# Patient Record
Sex: Female | Born: 1940 | Race: White | Hispanic: No | Marital: Married | Smoking: Never smoker
Health system: Southern US, Community
[De-identification: ages and names within clinical notes are randomized; demographics above are authoritative.]

## PROBLEM LIST (undated history)

## (undated) DIAGNOSIS — G20 Parkinson's disease: Secondary | ICD-10-CM

## (undated) DIAGNOSIS — R12 Heartburn: Secondary | ICD-10-CM

## (undated) DIAGNOSIS — I10 Essential (primary) hypertension: Secondary | ICD-10-CM

## (undated) DIAGNOSIS — R42 Dizziness and giddiness: Secondary | ICD-10-CM

## (undated) DIAGNOSIS — F329 Major depressive disorder, single episode, unspecified: Secondary | ICD-10-CM

## (undated) DIAGNOSIS — G20A1 Parkinson's disease without dyskinesia, without mention of fluctuations: Secondary | ICD-10-CM

## (undated) DIAGNOSIS — M419 Scoliosis, unspecified: Secondary | ICD-10-CM

## (undated) DIAGNOSIS — F419 Anxiety disorder, unspecified: Secondary | ICD-10-CM

## (undated) DIAGNOSIS — K219 Gastro-esophageal reflux disease without esophagitis: Secondary | ICD-10-CM

## (undated) DIAGNOSIS — E785 Hyperlipidemia, unspecified: Secondary | ICD-10-CM

## (undated) DIAGNOSIS — C801 Malignant (primary) neoplasm, unspecified: Secondary | ICD-10-CM

## (undated) DIAGNOSIS — F32A Depression, unspecified: Secondary | ICD-10-CM

## (undated) DIAGNOSIS — B159 Hepatitis A without hepatic coma: Secondary | ICD-10-CM

## (undated) HISTORY — PX: ABDOMINAL HYSTERECTOMY: SHX81

## (undated) HISTORY — DX: Anxiety disorder, unspecified: F41.9

## (undated) HISTORY — DX: Heartburn: R12

## (undated) HISTORY — DX: Hepatitis a without hepatic coma: B15.9

## (undated) HISTORY — DX: Essential (primary) hypertension: I10

## (undated) HISTORY — DX: Depression, unspecified: F32.A

## (undated) HISTORY — DX: Hyperlipidemia, unspecified: E78.5

## (undated) HISTORY — DX: Major depressive disorder, single episode, unspecified: F32.9

---

## 1960-05-09 DIAGNOSIS — B159 Hepatitis A without hepatic coma: Secondary | ICD-10-CM

## 1960-05-09 HISTORY — DX: Hepatitis a without hepatic coma: B15.9

## 1975-05-10 HISTORY — PX: ABDOMINAL HYSTERECTOMY: SHX81

## 2003-12-31 ENCOUNTER — Other Ambulatory Visit: Payer: Self-pay

## 2004-08-30 ENCOUNTER — Emergency Department: Payer: Self-pay | Admitting: Emergency Medicine

## 2004-12-13 ENCOUNTER — Ambulatory Visit: Payer: Self-pay | Admitting: Obstetrics and Gynecology

## 2005-11-25 ENCOUNTER — Ambulatory Visit: Payer: Self-pay | Admitting: Internal Medicine

## 2006-10-12 ENCOUNTER — Ambulatory Visit: Payer: Self-pay | Admitting: Internal Medicine

## 2007-08-04 ENCOUNTER — Ambulatory Visit: Payer: Self-pay | Admitting: Family Medicine

## 2008-04-28 ENCOUNTER — Ambulatory Visit: Payer: Self-pay | Admitting: Internal Medicine

## 2009-03-06 ENCOUNTER — Ambulatory Visit: Payer: Self-pay | Admitting: Internal Medicine

## 2010-01-18 ENCOUNTER — Ambulatory Visit: Payer: Self-pay | Admitting: Internal Medicine

## 2010-02-02 ENCOUNTER — Ambulatory Visit: Payer: Self-pay | Admitting: Internal Medicine

## 2010-02-24 ENCOUNTER — Inpatient Hospital Stay: Payer: Self-pay | Admitting: Family Medicine

## 2010-07-20 ENCOUNTER — Ambulatory Visit: Payer: Self-pay | Admitting: Internal Medicine

## 2010-08-27 ENCOUNTER — Ambulatory Visit: Payer: Self-pay | Admitting: Internal Medicine

## 2011-01-24 ENCOUNTER — Ambulatory Visit: Payer: Self-pay | Admitting: Family Medicine

## 2012-03-06 ENCOUNTER — Ambulatory Visit: Payer: Self-pay | Admitting: Internal Medicine

## 2012-08-24 ENCOUNTER — Ambulatory Visit: Payer: Self-pay | Admitting: Emergency Medicine

## 2012-10-04 ENCOUNTER — Ambulatory Visit: Payer: Self-pay | Admitting: Family Medicine

## 2012-10-25 ENCOUNTER — Ambulatory Visit: Payer: Self-pay | Admitting: Internal Medicine

## 2013-03-07 ENCOUNTER — Ambulatory Visit: Payer: Self-pay | Admitting: Internal Medicine

## 2013-03-28 ENCOUNTER — Ambulatory Visit: Payer: Self-pay | Admitting: Internal Medicine

## 2013-07-18 ENCOUNTER — Ambulatory Visit: Payer: Self-pay | Admitting: Internal Medicine

## 2013-10-09 ENCOUNTER — Emergency Department: Payer: Self-pay | Admitting: Emergency Medicine

## 2013-12-24 ENCOUNTER — Ambulatory Visit: Payer: Self-pay | Admitting: Unknown Physician Specialty

## 2014-06-09 ENCOUNTER — Ambulatory Visit: Payer: Self-pay | Admitting: Internal Medicine

## 2015-06-09 ENCOUNTER — Other Ambulatory Visit: Payer: Self-pay | Admitting: Internal Medicine

## 2015-06-09 DIAGNOSIS — Z1231 Encounter for screening mammogram for malignant neoplasm of breast: Secondary | ICD-10-CM

## 2015-06-16 ENCOUNTER — Other Ambulatory Visit: Payer: Self-pay | Admitting: Internal Medicine

## 2015-06-16 ENCOUNTER — Ambulatory Visit
Admission: RE | Admit: 2015-06-16 | Discharge: 2015-06-16 | Disposition: A | Discharge status: Home / Self Care | Payer: Medicare Other | Source: Ambulatory Visit | Attending: Internal Medicine | Admitting: Internal Medicine

## 2015-06-16 DIAGNOSIS — Z1231 Encounter for screening mammogram for malignant neoplasm of breast: Secondary | ICD-10-CM | POA: Diagnosis not present

## 2016-02-14 ENCOUNTER — Ambulatory Visit
Admission: EM | Admit: 2016-02-14 | Discharge: 2016-02-14 | Disposition: A | Discharge status: Home / Self Care | Payer: Medicare Other | Attending: Family Medicine | Admitting: Family Medicine

## 2016-02-14 ENCOUNTER — Encounter: Payer: Self-pay | Admitting: Emergency Medicine

## 2016-02-14 DIAGNOSIS — K0889 Other specified disorders of teeth and supporting structures: Secondary | ICD-10-CM

## 2016-02-14 HISTORY — DX: Parkinson's disease without dyskinesia, without mention of fluctuations: G20.A1

## 2016-02-14 HISTORY — DX: Parkinson's disease: G20

## 2016-02-14 MED ORDER — HYDROCODONE-ACETAMINOPHEN 5-325 MG PO TABS: 1.0000 | ORAL_TABLET | Freq: Four times a day (QID) | ORAL | 0 refills | Status: DC | PRN

## 2016-02-14 MED ORDER — AMOXICILLIN-POT CLAVULANATE 875-125 MG PO TABS: 1.0000 | ORAL_TABLET | Freq: Two times a day (BID) | ORAL | 0 refills | Status: DC

## 2016-02-14 NOTE — Discharge Instructions (Signed)
Take the medication as prescribed.  Be sure to see a Dentist ASAP.  Dr. Lacinda Axon

## 2016-02-14 NOTE — ED Provider Notes (Signed)
[facility]    CSN: [id number] Arrival date & time: 02/14/16  1327  History   Chief Complaint Chief Complaint  Patient presents with  . Dental Pain   HPI  75 year old female with Parkinson's, anxiety, depression, hypertension, hyperlipidemia presents with complaints of dental pain.  Patient reports severe dental pain. She states it is located at the left lower molar (last). No reported trauma or chipping of the tooth. She states that it started suddenly. She has used Orajel as well as Tylenol without relief. She states that she called her dentist and no one has returned her call. She states that it appears they do not take after-hours call. No other reported symptoms. No other complaints or concerns at this time.  PMH - Parkinson's, anxiety, depression, hypertension, hyperlipidemia  Past Surgical History:  Procedure Laterality Date  . ABDOMINAL HYSTERECTOMY     OB History    No data available     Home Medications    Prior to Admission medications   Medication Sig Start Date End Date Taking? Authorizing Provider  amoxicillin-clavulanate (AUGMENTIN) 875-125 MG tablet Take 1 tablet by mouth every 12 (twelve) hours. 02/14/16   Coral Spikes, DO  HYDROcodone-acetaminophen (NORCO/VICODIN) 5-325 MG tablet Take 1 tablet by mouth every 6 (six) hours as needed. 02/14/16   Coral Spikes, DO   Family History Family History  Problem Relation Age of Onset  . Breast cancer Mother 39   Social History Social History  Substance Use Topics  . Smoking status: Never Smoker  . Smokeless tobacco: Never Used  . Alcohol use No   Allergies   Ultram [tramadol hcl]  Review of Systems Review of Systems  Constitutional: Negative.   HENT:       Severe dental pain.  All other systems reviewed and are negative.  Physical Exam Triage Vital Signs ED Triage Vitals  Enc Vitals Group     BP 02/14/16 1351 (!) 149/65     Pulse Rate 02/14/16 1351 76     Resp 02/14/16 1351 16     Temp  02/14/16 1351 97.3 F (36.3 C)     Temp Source 02/14/16 1351 Tympanic     SpO2 02/14/16 1351 100 %     Weight 02/14/16 1349 120 lb (54.4 kg)     Height 02/14/16 1349 5\' 1"  (1.549 m)     Head Circumference --      Peak Flow --      Pain Score 02/14/16 1351 10     Pain Loc --      Pain Edu? --      Excl. in [place]? --    Updated Vital Signs BP (!) 149/65 (BP Location: Right Arm)   Pulse 76   Temp 97.3 F (36.3 C) (Tympanic)   Resp 16   Ht 5\' 1"  (1.549 m)   Wt 120 lb (54.4 kg)   SpO2 100%   BMI 22.67 kg/m   Physical Exam  Constitutional: She appears well-developed.  Appears in severe pain.  HENT:  Head: Normocephalic and atraumatic.  Oropharynx clear.  No discrete area of dental abscess. She is exquisitely tender to palpation of the last molar (lower left).  Eyes: Conjunctivae are normal.  Neck: Neck supple.  Cardiovascular: Normal rate and regular rhythm.   Pulmonary/Chest: Effort normal and breath sounds normal.  Abdominal: Soft. She exhibits no distension. There is no tenderness. There is no rebound and no guarding.  Musculoskeletal: Normal range of motion.  Neurological: She is  alert.  Tremor noted.  Skin: Skin is warm. No rash noted.  Psychiatric: She has a normal mood and affect.  Vitals reviewed.  UC Treatments / Results  Labs (all labs ordered are listed, but only abnormal results are displayed) Labs Reviewed - No data to display  EKG  EKG Interpretation None      Radiology No results found.  Procedures Procedures (including critical care time)  Medications Ordered in UC Medications - No data to display  Initial Impression / Assessment and Plan / UC Course  I have reviewed the triage vital signs and the nursing notes.  Pertinent labs & imaging results that were available during my care of the patient were reviewed by me and considered in my medical decision making (see chart for details).  Clinical Course  75 year old female presents with acute  dental pain. I cannot discern a discrete area of abscess or infection. However, given severity of pain will cover with Augmentin.  Treating pain with Vicodin. Patient to call her dentist or find another dentist to see urgently tomorrow.  Final Clinical Impressions(s) / UC Diagnoses   Final diagnoses:  Pain, dental   New Prescriptions Discharge Medication List as of 02/14/2016  2:23 PM    START taking these medications   Details  amoxicillin-clavulanate (AUGMENTIN) 875-125 MG tablet Take 1 tablet by mouth every 12 (twelve) hours., Starting Sun 02/14/2016, Print    HYDROcodone-acetaminophen (NORCO/VICODIN) 5-325 MG tablet Take 1 tablet by mouth every 6 (six) hours as needed., Starting Sun 02/14/2016, [place], DO 02/14/16 1433

## 2016-02-14 NOTE — ED Triage Notes (Signed)
Patient c/o tooth pain on the left side on the bottom that started on Thursday.

## 2016-06-20 ENCOUNTER — Other Ambulatory Visit: Payer: Self-pay | Admitting: Internal Medicine

## 2016-06-20 DIAGNOSIS — Z1231 Encounter for screening mammogram for malignant neoplasm of breast: Secondary | ICD-10-CM

## 2016-07-18 ENCOUNTER — Ambulatory Visit: Payer: Medicare Other

## 2016-08-15 ENCOUNTER — Ambulatory Visit
Admission: RE | Admit: 2016-08-15 | Discharge: 2016-08-15 | Disposition: A | Discharge status: Home / Self Care | Payer: Medicare Other | Source: Ambulatory Visit | Attending: Internal Medicine | Admitting: Internal Medicine

## 2016-08-15 DIAGNOSIS — Z1231 Encounter for screening mammogram for malignant neoplasm of breast: Secondary | ICD-10-CM | POA: Diagnosis not present

## 2016-08-15 DIAGNOSIS — N6489 Other specified disorders of breast: Secondary | ICD-10-CM | POA: Diagnosis not present

## 2016-12-01 NOTE — Progress Notes (Signed)
12/02/2016 11:01 AM   Julie Lynn 1941-03-28 [id number]  Referring provider: Adin Hector, MD [facility] [place], [place] [address]  Chief Complaint  Patient presents with  . New Patient (Initial Visit)    urinary incontinence referred by Dr. Kerrin Mo    HPI: Patient is a 76 -year-old Caucasian female who is referred to [place] by, Dr. Kerrin Mo, for urinary incontinence.  Patient states that she has had urinary incontinence for two to three months.  Patient has incontinence with SUI and UI.   She is experiencing several little incontinent episodes during the day. She is experiencing zero incontinent episodes during the night.  Her incontinence volume is mild to moderate   She is wearing one pads/depends daily.    She is having associated urgency, nocturia and intermittency.  She does not have a history of urinary tract infections, STI's or injury to the bladder.   She denies dysuria, gross hematuria, suprapubic pain, back pain, abdominal pain or flank pain.  She has not had any recent fevers, chills, nausea or vomiting.   She does not have a history of nephrolithiasis, GU surgery or GU trauma.   She is not sexually active.  She is post menopausal.   She admits to constipation and/or diarrhea.  She is having pain with bladder filling.    She has not had any recent imaging studies.    She is not drinking enough water daily.   She is not drinking caffeinated beverages daily.  She is not drinking alcoholic beverages daily.    Her risk factors for incontinence are a family history of incontinence, age, vaginal atrophy, pelvic surgery and neurological disorders.   She is taking skeletal muscle relaxants.  Her PVR is 0 mL.    Reviewed referral notes.    PMH: Past Medical History:  Diagnosis Date  . Anxiety   . Depression   . Heartburn   . Hepatitis A   . HLD (hyperlipidemia)   . HTN (hypertension)   . Parkinson disease [facility])      Surgical History: Past Surgical History:  Procedure Laterality Date  . ABDOMINAL HYSTERECTOMY      Home Medications:  Allergies as of 12/02/2016      Reactions   Simvastatin Other (See Comments)   Unspecified   Ultram [tramadol Hcl] Swelling      Medication List       Accurate as of 12/02/16 11:01 AM. Always use your most recent med list.          alendronate 70 MG tablet Commonly known as:  FOSAMAX Take by mouth.   amoxicillin-clavulanate 875-125 MG tablet Commonly known as:  AUGMENTIN Take 1 tablet by mouth every 12 (twelve) hours.   aspirin 81 MG tablet Take by mouth.   carbidopa-levodopa 25-100 MG tablet Commonly known as:  SINEMET IR Take 6 and 1/2 tablets daily as directed   ezetimibe 10 MG tablet Commonly known as:  ZETIA Take by mouth.   HYDROcodone-acetaminophen 5-325 MG tablet Commonly known as:  NORCO/VICODIN Take 1 tablet by mouth every 6 (six) hours as needed.   omeprazole 20 MG capsule Commonly known as:  PRILOSEC TAKE 1 CAPSULE DAILY   Vitamin D (Ergocalciferol) 50000 units Caps capsule Commonly known as:  DRISDOL TAKE 1 CAPSULE ONCE A WEEK       Allergies:  Allergies  Allergen Reactions  . Simvastatin Other (See Comments)    Unspecified  . Ultram [place] Mode  Hcl] Swelling    Family History: Family History  Problem Relation Age of Onset  . Breast cancer Mother 54  . Kidney cancer Neg Hx   . Bladder Cancer Neg Hx     Social History:  reports that she has never smoked. She has never used smokeless tobacco. She reports that she does not drink alcohol or use drugs.  ROS: UROLOGY Frequent Urination?: No Hard to postpone urination?: Yes Burning/pain with urination?: No Get up at night to urinate?: Yes Leakage of urine?: Yes Urine stream starts and stops?: Yes Trouble starting stream?: No Do you have to strain to urinate?: No Blood in urine?: No Urinary tract infection?: No Sexually transmitted disease?: No Injury to  kidneys or bladder?: No Painful intercourse?: No Weak stream?: No Currently pregnant?: No Vaginal bleeding?: No Last menstrual period?: n  Gastrointestinal Nausea?: Yes Vomiting?: No Indigestion/heartburn?: No Diarrhea?: No Constipation?: No  Constitutional Fever: No Night sweats?: No Weight loss?: No Fatigue?: Yes  Skin Skin rash/lesions?: No Itching?: Yes  Eyes Blurred vision?: No Double vision?: No  Ears/Nose/Throat Sore throat?: No Sinus problems?: No  Hematologic/Lymphatic Swollen glands?: No Easy bruising?: Yes  Cardiovascular Leg swelling?: No Chest pain?: No  Respiratory Cough?: No Shortness of breath?: No  Endocrine Excessive thirst?: No  Musculoskeletal Back pain?: No Joint pain?: No  Neurological Headaches?: Yes Dizziness?: No  Psychologic Depression?: No Anxiety?: No  Physical Exam: BP 127/65   Pulse 77   Ht 5\' 1"  (1.549 m)   Wt 130 lb (59 kg)   BMI 24.56 kg/m   Constitutional: Well nourished. Alert and oriented, No acute distress. HEENT: [place] AT, moist mucus membranes. Trachea midline, no masses. Cardiovascular: No clubbing, cyanosis, or edema. Respiratory: Normal respiratory effort, no increased work of breathing. GI: Abdomen is soft, non tender, non distended, no abdominal masses. Liver and spleen not palpable.  No hernias appreciated.  Stool sample for occult testing is not indicated.   GU: No CVA tenderness.  No bladder fullness or masses.  Atrophic external genitalia, normal pubic hair distribution, no lesions.  Normal urethral meatus, no lesions, no prolapse, no discharge.   No urethral masses, tenderness and/or tenderness. No bladder fullness, tenderness or masses. Pale vagina mucosa, poor estrogen effect, no discharge, no lesions, good pelvic support, Grade I cystocele is noted.  No rectocele noted.  Cervix and uterus are surgically absent.  No adnexal/parametria masses or tenderness noted.  Anus and perineum are without  rashes or lesions.    Skin: No rashes, bruises or suspicious lesions. Lymph: No cervical or inguinal adenopathy. Neurologic: Grossly intact, no focal deficits, moving all 4 extremities. Psychiatric: Normal mood and affect.   Pertinent Imaging: Results for ANIRA, SENEGAL (MRN [id number]) as of 12/02/2016 10:47  Ref. Range 12/02/2016 10:23  Scan Result Unknown 0   I have independently reviewed the films.    Assessment & Plan:    1. Urge incontinence  - discussed behavioral therapies, bladder training and bladder control strategies  - pelvic floor muscle training - Kegel's at home  - fluid management - increase water  - not a candidate for anticholinergic therapy due to age and Parkinson's   - would like to try the beta-3 adrenergic receptor agonist (Myrbetriq).  Given Myrbetriq 25 mg samples, #28.  I have reviewed with the patient of the side effects of Myrbetriq, such as: elevation in BP, urinary retention and/or HA.     - RTC in 3 weeks for PVR and OAB questionnaire  2. Vaginal  atrophy  - Patient was given a sample of vaginal estrogen cream (Premarin) and instructed to apply 0.5mg  (pea-sized amount)  just inside the vaginal introitus with a finger-tip every night for two weeks and then Monday, Wednesday and Friday nights.  I explained to the patient that vaginally administered estrogen, which causes only a slight increase in the blood estrogen levels, have fewer contraindications and adverse systemic effects that oral HT.  - She will follow up in three months for an exam.     Return in about 3 weeks (around 12/23/2016) for PVR and OAB questionnaire.  These notes generated with voice recognition software. I apologize for typographical errors.  Zara Council, [facility] [address], [place], [place] [address] [phone]

## 2016-12-02 ENCOUNTER — Ambulatory Visit (INDEPENDENT_AMBULATORY_CARE_PROVIDER_SITE_OTHER): Payer: Medicare Other | Admitting: Urology

## 2016-12-02 ENCOUNTER — Encounter: Payer: Self-pay | Admitting: Urology

## 2016-12-02 VITALS — BP 127/65 | HR 77 | Ht 61.0 in | Wt 130.0 lb

## 2016-12-02 DIAGNOSIS — N3941 Urge incontinence: Secondary | ICD-10-CM | POA: Diagnosis not present

## 2016-12-02 DIAGNOSIS — R32 Unspecified urinary incontinence: Secondary | ICD-10-CM

## 2016-12-02 LAB — BLADDER SCAN AMB NON-IMAGING: Scan Result: 0

## 2016-12-23 ENCOUNTER — Ambulatory Visit: Payer: Medicare Other | Admitting: Urology

## 2017-05-09 DIAGNOSIS — C801 Malignant (primary) neoplasm, unspecified: Secondary | ICD-10-CM

## 2017-05-09 HISTORY — DX: Malignant (primary) neoplasm, unspecified: C80.1

## 2017-07-25 ENCOUNTER — Other Ambulatory Visit: Payer: Self-pay | Admitting: Internal Medicine

## 2017-07-25 ENCOUNTER — Other Ambulatory Visit: Payer: Self-pay | Admitting: Family Medicine

## 2017-07-25 DIAGNOSIS — R519 Headache, unspecified: Secondary | ICD-10-CM

## 2017-07-25 DIAGNOSIS — Z1231 Encounter for screening mammogram for malignant neoplasm of breast: Secondary | ICD-10-CM

## 2017-07-25 DIAGNOSIS — R42 Dizziness and giddiness: Secondary | ICD-10-CM

## 2017-07-25 DIAGNOSIS — R51 Headache: Principal | ICD-10-CM

## 2017-07-26 ENCOUNTER — Other Ambulatory Visit: Payer: Self-pay | Admitting: Family Medicine

## 2017-07-26 DIAGNOSIS — R51 Headache: Principal | ICD-10-CM

## 2017-07-26 DIAGNOSIS — R42 Dizziness and giddiness: Secondary | ICD-10-CM

## 2017-07-26 DIAGNOSIS — R519 Headache, unspecified: Secondary | ICD-10-CM

## 2017-08-08 ENCOUNTER — Ambulatory Visit
Admission: RE | Admit: 2017-08-08 | Discharge: 2017-08-08 | Disposition: A | Discharge status: Home / Self Care | Payer: Medicare Other | Source: Ambulatory Visit | Attending: Family Medicine | Admitting: Family Medicine

## 2017-08-08 DIAGNOSIS — R42 Dizziness and giddiness: Secondary | ICD-10-CM | POA: Diagnosis present

## 2017-08-08 DIAGNOSIS — G3189 Other specified degenerative diseases of nervous system: Secondary | ICD-10-CM | POA: Insufficient documentation

## 2017-08-08 DIAGNOSIS — R519 Headache, unspecified: Secondary | ICD-10-CM

## 2017-08-08 DIAGNOSIS — R51 Headache: Secondary | ICD-10-CM | POA: Diagnosis not present

## 2017-08-17 ENCOUNTER — Ambulatory Visit
Admission: RE | Admit: 2017-08-17 | Discharge: 2017-08-17 | Disposition: A | Discharge status: Home / Self Care | Payer: Medicare Other | Source: Ambulatory Visit | Attending: Internal Medicine | Admitting: Internal Medicine

## 2017-08-17 DIAGNOSIS — Z1231 Encounter for screening mammogram for malignant neoplasm of breast: Secondary | ICD-10-CM | POA: Diagnosis not present

## 2017-08-17 HISTORY — DX: Malignant (primary) neoplasm, unspecified: C80.1

## 2017-10-25 ENCOUNTER — Encounter: Payer: Self-pay | Admitting: *Deleted

## 2017-10-25 ENCOUNTER — Other Ambulatory Visit: Payer: Self-pay

## 2017-10-25 NOTE — Discharge Instructions (Signed)

## 2017-11-01 ENCOUNTER — Ambulatory Visit: Payer: Medicare Other | Admitting: Anesthesiology

## 2017-11-01 ENCOUNTER — Ambulatory Visit
Admission: RE | Admit: 2017-11-01 | Discharge: 2017-11-01 | Disposition: A | Discharge status: Home / Self Care | Payer: Medicare Other | Source: Ambulatory Visit | Attending: Ophthalmology | Admitting: Ophthalmology

## 2017-11-01 ENCOUNTER — Other Ambulatory Visit: Payer: Self-pay

## 2017-11-01 ENCOUNTER — Encounter
Admission: RE | Disposition: A | Discharge status: Home / Self Care | Payer: Self-pay | Source: Ambulatory Visit | Attending: Ophthalmology

## 2017-11-01 ENCOUNTER — Encounter: Payer: Self-pay | Admitting: *Deleted

## 2017-11-01 DIAGNOSIS — E7800 Pure hypercholesterolemia, unspecified: Secondary | ICD-10-CM | POA: Insufficient documentation

## 2017-11-01 DIAGNOSIS — G20 Parkinson's disease: Secondary | ICD-10-CM | POA: Insufficient documentation

## 2017-11-01 DIAGNOSIS — I10 Essential (primary) hypertension: Secondary | ICD-10-CM | POA: Insufficient documentation

## 2017-11-01 DIAGNOSIS — Z79899 Other long term (current) drug therapy: Secondary | ICD-10-CM | POA: Insufficient documentation

## 2017-11-01 DIAGNOSIS — H2512 Age-related nuclear cataract, left eye: Secondary | ICD-10-CM | POA: Diagnosis not present

## 2017-11-01 DIAGNOSIS — Z7983 Long term (current) use of bisphosphonates: Secondary | ICD-10-CM | POA: Diagnosis not present

## 2017-11-01 DIAGNOSIS — K219 Gastro-esophageal reflux disease without esophagitis: Secondary | ICD-10-CM | POA: Insufficient documentation

## 2017-11-01 DIAGNOSIS — Z85828 Personal history of other malignant neoplasm of skin: Secondary | ICD-10-CM | POA: Diagnosis not present

## 2017-11-01 DIAGNOSIS — M810 Age-related osteoporosis without current pathological fracture: Secondary | ICD-10-CM | POA: Insufficient documentation

## 2017-11-01 HISTORY — DX: Dizziness and giddiness: R42

## 2017-11-01 HISTORY — DX: Gastro-esophageal reflux disease without esophagitis: K21.9

## 2017-11-01 HISTORY — DX: Scoliosis, unspecified: M41.9

## 2017-11-01 HISTORY — PX: CATARACT EXTRACTION W/PHACO: SHX586

## 2017-11-01 SURGERY — PHACOEMULSIFICATION, CATARACT, WITH IOL INSERTION
Anesthesia: Monitor Anesthesia Care | Site: Eye | Laterality: Left | Wound class: "Clean "

## 2017-11-01 MED ORDER — NA HYALUR & NA CHOND-NA HYALUR 0.4-0.35 ML IO KIT
PACK | INTRAOCULAR | Status: DC | PRN
  Administered 2017-11-01: 1 mL via INTRAOCULAR

## 2017-11-01 MED ORDER — MOXIFLOXACIN HCL 0.5 % OP SOLN
1.0000 [drp] | OPHTHALMIC | Status: DC | PRN
  Administered 2017-11-01 (×3): 1 [drp] via OPHTHALMIC

## 2017-11-01 MED ORDER — LIDOCAINE HCL (PF) 2 % IJ SOLN
INTRAOCULAR | Status: DC | PRN
  Administered 2017-11-01: 1 mL

## 2017-11-01 MED ORDER — MIDAZOLAM HCL 2 MG/2ML IJ SOLN
INTRAMUSCULAR | Status: DC | PRN
  Administered 2017-11-01: 1 mg via INTRAVENOUS

## 2017-11-01 MED ORDER — ONDANSETRON HCL 4 MG/2ML IJ SOLN: 4.0000 mg | Freq: Once | INTRAMUSCULAR | Status: DC | PRN

## 2017-11-01 MED ORDER — FENTANYL CITRATE (PF) 100 MCG/2ML IJ SOLN
INTRAMUSCULAR | Status: DC | PRN
  Administered 2017-11-01: 50 ug via INTRAVENOUS

## 2017-11-01 MED ORDER — CEFUROXIME OPHTHALMIC INJECTION 1 MG/0.1 ML
INJECTION | OPHTHALMIC | Status: DC | PRN
  Administered 2017-11-01: 0.1 mL via INTRACAMERAL

## 2017-11-01 MED ORDER — ARMC OPHTHALMIC DILATING DROPS
1.0000 "application " | OPHTHALMIC | Status: DC | PRN
  Administered 2017-11-01 (×3): 1 via OPHTHALMIC

## 2017-11-01 MED ORDER — EPINEPHRINE PF 1 MG/ML IJ SOLN
INTRAOCULAR | Status: DC | PRN
  Administered 2017-11-01: 42 mL via OPHTHALMIC

## 2017-11-01 MED ORDER — LACTATED RINGERS IV SOLN: 10.0000 mL/h | INTRAVENOUS | Status: DC

## 2017-11-01 MED ORDER — BRIMONIDINE TARTRATE-TIMOLOL 0.2-0.5 % OP SOLN
OPHTHALMIC | Status: DC | PRN
  Administered 2017-11-01: 1 [drp] via OPHTHALMIC

## 2017-11-01 SURGICAL SUPPLY — 20 items
CANNULA ANT/CHMB 27G (MISCELLANEOUS) ×1 IMPLANT
CANNULA ANT/CHMB 27GA (MISCELLANEOUS) ×2 IMPLANT
GLOVE SURG LX 7.5 STRW (GLOVE) ×1
GLOVE SURG LX STRL 7.5 STRW (GLOVE) ×1 IMPLANT
GLOVE SURG TRIUMPH 8.0 PF LTX (GLOVE) ×2 IMPLANT
GOWN STRL REUS W/ TWL LRG LVL3 (GOWN DISPOSABLE) ×2 IMPLANT
GOWN STRL REUS W/TWL LRG LVL3 (GOWN DISPOSABLE) ×2
LENS IOL TECNIS ITEC 21.5 (Intraocular Lens) ×1 IMPLANT
MARKER SKIN DUAL TIP RULER LAB (MISCELLANEOUS) ×2 IMPLANT
NDL FILTER BLUNT 18X1 1/2 (NEEDLE) ×1 IMPLANT
NEEDLE FILTER BLUNT 18X 1/2SAF (NEEDLE) ×1
NEEDLE FILTER BLUNT 18X1 1/2 (NEEDLE) ×1 IMPLANT
PACK CATARACT BRASINGTON (MISCELLANEOUS) ×2 IMPLANT
PACK EYE AFTER SURG (MISCELLANEOUS) ×2 IMPLANT
PACK OPTHALMIC (MISCELLANEOUS) ×2 IMPLANT
SYR 3ML LL SCALE MARK (SYRINGE) ×2 IMPLANT
SYR 5ML LL (SYRINGE) ×2 IMPLANT
SYR TB 1ML LUER SLIP (SYRINGE) ×2 IMPLANT
WATER STERILE IRR 500ML POUR (IV SOLUTION) ×2 IMPLANT
WIPE NON LINTING 3.25X3.25 (MISCELLANEOUS) ×2 IMPLANT

## 2017-11-01 NOTE — Anesthesia Procedure Notes (Signed)
Procedure Name: Yetter Performed by: Cameron Ali, CRNA Pre-anesthesia Checklist: Patient identified, Emergency Drugs available, Suction available, Timeout performed and Patient being monitored Patient Re-evaluated:Patient Re-evaluated prior to induction Oxygen Delivery Method: Nasal cannula Placement Confirmation: positive ETCO2

## 2017-11-01 NOTE — Transfer of Care (Signed)
Immediate Anesthesia Transfer of Care Note  Patient: Julie Lynn  Procedure(s) Performed: CATARACT EXTRACTION PHACO AND INTRAOCULAR LENS PLACEMENT (IOC) LEFT (Left Eye)  Patient Location: PACU  Anesthesia Type: MAC  Level of Consciousness: awake, alert  and patient cooperative  Airway and Oxygen Therapy: Patient Spontanous Breathing and Patient connected to supplemental oxygen  Post-op Assessment: Post-op Vital signs reviewed, Patient's Cardiovascular Status Stable, Respiratory Function Stable, Patent Airway and No signs of Nausea or vomiting  Post-op Vital Signs: Reviewed and stable  Complications: No apparent anesthesia complications

## 2017-11-01 NOTE — Anesthesia Preprocedure Evaluation (Signed)
Anesthesia Evaluation  Patient identified by MRN, date of birth, ID band Patient awake    Reviewed: Allergy & Precautions, NPO status , Patient's Chart, lab work & pertinent test results  Airway Mallampati: II  TM Distance: >3 FB     Dental   Pulmonary neg recent URI,    breath sounds clear to auscultation       Cardiovascular hypertension, (-) angina Rhythm:Regular Rate:Normal  HLD   Neuro/Psych Anxiety Depression Parkinson's disease    GI/Hepatic GERD  Medicated,  Endo/Other  negative endocrine ROS  Renal/GU negative Renal ROS     Musculoskeletal   Abdominal   Peds  Hematology negative hematology ROS (+)   Anesthesia Other Findings   Reproductive/Obstetrics                            Anesthesia Physical Anesthesia Plan  ASA: III  Anesthesia Plan: MAC   Post-op Pain Management:    Induction: Intravenous  PONV Risk Score and Plan:   Airway Management Planned: Nasal Cannula  Additional Equipment:   Intra-op Plan:   Post-operative Plan:   Informed Consent: I have reviewed the patients History and Physical, chart, labs and discussed the procedure including the risks, benefits and alternatives for the proposed anesthesia with the patient or authorized representative who has indicated his/her understanding and acceptance.     Plan Discussed with: CRNA  Anesthesia Plan Comments:         Anesthesia Quick Evaluation

## 2017-11-01 NOTE — Op Note (Signed)
OPERATIVE NOTE  THAIS SILBERSTEIN [id number] 11/01/2017   PREOPERATIVE DIAGNOSIS:  Nuclear sclerotic cataract left eye. H25.12   POSTOPERATIVE DIAGNOSIS:    Nuclear sclerotic cataract left eye.     PROCEDURE:  Phacoemusification with posterior chamber intraocular lens placement of the left eye   LENS:   Implant Name Type Inv. Item Serial No. Manufacturer Lot No. LRB No. Used  LENS IOL DIOP 21.5 - [id number] Intraocular Lens LENS IOL DIOP 21.5 [id number]  Left 1        ULTRASOUND TIME: 23  % of 0 minutes 38 seconds, CDE 9.1  SURGEON:  Wyonia Hough, MD   ANESTHESIA:  Topical with tetracaine drops and 2% Xylocaine jelly, augmented with 1% preservative-free intracameral lidocaine.    COMPLICATIONS:  None.   DESCRIPTION OF PROCEDURE:  The patient was identified in the holding room and transported to the operating room and placed in the supine position under the operating microscope.  The left eye was identified as the operative eye and it was prepped and draped in the usual sterile ophthalmic fashion.   A 1 millimeter clear-corneal paracentesis was made at the 1:30 position.  0.5 ml of preservative-free 1% lidocaine was injected into the anterior chamber.  The anterior chamber was filled with Viscoat viscoelastic.  A 2.4 millimeter keratome was used to make a near-clear corneal incision at the 10:30 position.  .  A curvilinear capsulorrhexis was made with a cystotome and capsulorrhexis forceps.  Balanced salt solution was used to hydrodissect and hydrodelineate the nucleus.   Phacoemulsification was then used in stop and chop fashion to remove the lens nucleus and epinucleus.  The remaining cortex was then removed using the irrigation and aspiration handpiece. Provisc was then placed into the capsular bag to distend it for lens placement.  A lens was then injected into the capsular bag.  The remaining viscoelastic was aspirated.   Wounds were hydrated with balanced salt solution.   The anterior chamber was inflated to a physiologic pressure with balanced salt solution.  No wound leaks were noted. Cefuroxime 0.1 ml of a 10mg /ml solution was injected into the anterior chamber for a dose of 1 mg of intracameral antibiotic at the completion of the case.   Timolol and Brimonidine drops were applied to the eye.  The patient was taken to the recovery room in stable condition without complications of anesthesia or surgery.  Kashia Brossard 11/01/2017, 8:58 AM

## 2017-11-01 NOTE — H&P (Signed)
The History and Physical notes are on paper, have been signed, and are to be scanned. The patient remains stable and unchanged from the H&P.   Previous H&P reviewed, patient examined, and there are no changes.  Julie Lynn 11/01/2017 8:12 AM

## 2017-11-01 NOTE — Anesthesia Postprocedure Evaluation (Signed)
Anesthesia Post Note  Patient: Julie Lynn  Procedure(s) Performed: CATARACT EXTRACTION PHACO AND INTRAOCULAR LENS PLACEMENT (IOC) LEFT (Left Eye)  Patient location during evaluation: PACU Anesthesia Type: MAC Level of consciousness: awake and alert Pain management: pain level controlled Vital Signs Assessment: post-procedure vital signs reviewed and stable Respiratory status: spontaneous breathing, nonlabored ventilation, respiratory function stable and patient connected to nasal cannula oxygen Cardiovascular status: stable and blood pressure returned to baseline Postop Assessment: no apparent nausea or vomiting Anesthetic complications: no    Veda Canning

## 2017-11-02 ENCOUNTER — Encounter: Payer: Self-pay | Admitting: Ophthalmology

## 2018-05-22 ENCOUNTER — Other Ambulatory Visit (HOSPITAL_COMMUNITY): Payer: Self-pay | Admitting: Internal Medicine

## 2018-05-22 ENCOUNTER — Other Ambulatory Visit: Payer: Self-pay | Admitting: Internal Medicine

## 2018-05-22 DIAGNOSIS — R1011 Right upper quadrant pain: Secondary | ICD-10-CM

## 2018-05-29 ENCOUNTER — Encounter (INDEPENDENT_AMBULATORY_CARE_PROVIDER_SITE_OTHER): Payer: Self-pay

## 2018-05-29 ENCOUNTER — Ambulatory Visit
Admission: RE | Admit: 2018-05-29 | Discharge: 2018-05-29 | Disposition: A | Discharge status: Home / Self Care | Payer: Medicare Other | Source: Ambulatory Visit | Attending: Internal Medicine | Admitting: Internal Medicine

## 2018-05-29 DIAGNOSIS — R1011 Right upper quadrant pain: Secondary | ICD-10-CM | POA: Diagnosis not present

## 2018-06-14 ENCOUNTER — Encounter: Payer: Self-pay | Admitting: *Deleted

## 2018-06-15 ENCOUNTER — Ambulatory Visit: Payer: Medicare Other | Admitting: Anesthesiology

## 2018-06-15 ENCOUNTER — Encounter
Admission: RE | Disposition: A | Discharge status: Home / Self Care | Payer: Self-pay | Source: Home / Self Care | Attending: Gastroenterology

## 2018-06-15 ENCOUNTER — Encounter: Payer: Self-pay | Admitting: *Deleted

## 2018-06-15 ENCOUNTER — Ambulatory Visit
Admission: RE | Admit: 2018-06-15 | Discharge: 2018-06-15 | Disposition: A | Discharge status: Home / Self Care | Payer: Medicare Other | Attending: Gastroenterology | Admitting: Gastroenterology

## 2018-06-15 DIAGNOSIS — I10 Essential (primary) hypertension: Secondary | ICD-10-CM | POA: Insufficient documentation

## 2018-06-15 DIAGNOSIS — Z79899 Other long term (current) drug therapy: Secondary | ICD-10-CM | POA: Diagnosis not present

## 2018-06-15 DIAGNOSIS — G20 Parkinson's disease: Secondary | ICD-10-CM | POA: Diagnosis not present

## 2018-06-15 DIAGNOSIS — E785 Hyperlipidemia, unspecified: Secondary | ICD-10-CM | POA: Insufficient documentation

## 2018-06-15 DIAGNOSIS — D127 Benign neoplasm of rectosigmoid junction: Secondary | ICD-10-CM | POA: Insufficient documentation

## 2018-06-15 DIAGNOSIS — Z85828 Personal history of other malignant neoplasm of skin: Secondary | ICD-10-CM | POA: Diagnosis not present

## 2018-06-15 DIAGNOSIS — K210 Gastro-esophageal reflux disease with esophagitis: Secondary | ICD-10-CM | POA: Insufficient documentation

## 2018-06-15 DIAGNOSIS — Z7983 Long term (current) use of bisphosphonates: Secondary | ICD-10-CM | POA: Insufficient documentation

## 2018-06-15 DIAGNOSIS — K317 Polyp of stomach and duodenum: Secondary | ICD-10-CM | POA: Diagnosis not present

## 2018-06-15 DIAGNOSIS — K640 First degree hemorrhoids: Secondary | ICD-10-CM | POA: Diagnosis not present

## 2018-06-15 DIAGNOSIS — Z1211 Encounter for screening for malignant neoplasm of colon: Secondary | ICD-10-CM | POA: Insufficient documentation

## 2018-06-15 DIAGNOSIS — K5730 Diverticulosis of large intestine without perforation or abscess without bleeding: Secondary | ICD-10-CM | POA: Diagnosis not present

## 2018-06-15 DIAGNOSIS — K30 Functional dyspepsia: Secondary | ICD-10-CM | POA: Insufficient documentation

## 2018-06-15 HISTORY — PX: COLONOSCOPY WITH PROPOFOL: SHX5780

## 2018-06-15 HISTORY — PX: ESOPHAGOGASTRODUODENOSCOPY (EGD) WITH PROPOFOL: SHX5813

## 2018-06-15 SURGERY — ESOPHAGOGASTRODUODENOSCOPY (EGD) WITH PROPOFOL
Anesthesia: General

## 2018-06-15 MED ORDER — PROPOFOL 500 MG/50ML IV EMUL
INTRAVENOUS | Status: DC | PRN
  Administered 2018-06-15: 100 ug/kg/min via INTRAVENOUS

## 2018-06-15 MED ORDER — LIDOCAINE HCL (CARDIAC) PF 100 MG/5ML IV SOSY
PREFILLED_SYRINGE | INTRAVENOUS | Status: DC | PRN
  Administered 2018-06-15: 30 mg via INTRAVENOUS

## 2018-06-15 MED ORDER — EPHEDRINE SULFATE 50 MG/ML IJ SOLN
INTRAMUSCULAR | Status: DC | PRN
  Administered 2018-06-15 (×2): 10 mg via INTRAVENOUS

## 2018-06-15 MED ORDER — PHENYLEPHRINE HCL 10 MG/ML IJ SOLN
INTRAMUSCULAR | Status: AC
  Filled 2018-06-15: qty 1

## 2018-06-15 MED ORDER — EPHEDRINE SULFATE 50 MG/ML IJ SOLN
INTRAMUSCULAR | Status: AC
  Filled 2018-06-15: qty 1

## 2018-06-15 MED ORDER — PROPOFOL 500 MG/50ML IV EMUL
INTRAVENOUS | Status: AC
  Filled 2018-06-15: qty 50

## 2018-06-15 MED ORDER — LIDOCAINE HCL (PF) 2 % IJ SOLN
INTRAMUSCULAR | Status: AC
  Filled 2018-06-15: qty 10

## 2018-06-15 MED ORDER — BUTAMBEN-TETRACAINE-BENZOCAINE 2-2-14 % EX AERO
INHALATION_SPRAY | CUTANEOUS | Status: DC | PRN
  Administered 2018-06-15: 2 via TOPICAL

## 2018-06-15 MED ORDER — SODIUM CHLORIDE 0.9 % IV SOLN
INTRAVENOUS | Status: DC
  Administered 2018-06-15: 1000 mL via INTRAVENOUS

## 2018-06-15 MED ORDER — PHENYLEPHRINE HCL 10 MG/ML IJ SOLN
INTRAMUSCULAR | Status: DC | PRN
  Administered 2018-06-15 (×3): 50 ug via INTRAVENOUS

## 2018-06-15 NOTE — Op Note (Signed)
[facility] Gastroenterology Patient Name: Julie Lynn Procedure Date: 06/15/2018 9:24 AM MRN: [id number] Account #: [id number] Date of Birth: 02-Dec-1940 Admit Type: Outpatient Age: 78 Room: [facility] ENDO ROOM 2 Gender: Female Note Status: Finalized Procedure:            Colonoscopy Indications:          Screening for colorectal malignant neoplasm Providers:            Lollie Sails, MD Referring MD:         Ramonita Lab, MD (Referring MD) Medicines:            Monitored Anesthesia Care Complications:        No immediate complications. Procedure:            Pre-Anesthesia Assessment:                       - ASA Grade Assessment: III - A patient with severe                        systemic disease.                       After obtaining informed consent, the colonoscope was                        passed under direct vision. Throughout the procedure,                        the patient's blood pressure, pulse, and oxygen                        saturations were monitored continuously. The [place] (S#: [id number]) was introduced through                        the anus and advanced to the the cecum, identified by                        appendiceal orifice and ileocecal valve. The                        colonoscopy was performed with moderate difficulty due                        to significant looping. Successful completion of the                        procedure was aided by changing the patient to a supine                        position and using manual pressure. The patient                        tolerated the procedure well. The quality of the bowel                        preparation was good. Findings:      A 4 mm polyp was found in the  recto-sigmoid colon. The polyp was       sessile. The polyp was removed with a cold snare. Resection and       retrieval were complete.      Multiple small-mouthed diverticula were found in  the sigmoid colon and       descending colon.      Biopsies for histology were taken with a cold forceps from the right       colon and left colon for evaluation of microscopic colitis.      Non-bleeding internal hemorrhoids were found during retroflexion. The       hemorrhoids were small and Grade I (internal hemorrhoids that do not       prolapse).      No additional abnormalities were found on retroflexion.      The digital rectal exam was normal. Impression:           - One 4 mm polyp at the recto-sigmoid colon, removed                        with a cold snare. Resected and retrieved.                       - Diverticulosis in the sigmoid colon and in the                        descending colon.                       - Non-bleeding internal hemorrhoids.                       - Biopsies were taken with a cold forceps from the                        right colon and left colon for evaluation of                        microscopic colitis. Recommendation:       - Discharge patient to home. Procedure Code(s):    --- Professional ---                       [phone], Colonoscopy, flexible; with removal of tumor(s),                        polyp(s), or other lesion(s) by snare technique                       45380, 59, Colonoscopy, flexible; with biopsy, single                        or multiple Diagnosis Code(s):    --- Professional ---                       Z12.11, Encounter for screening for malignant neoplasm                        of colon                       D12.7, Benign neoplasm of rectosigmoid junction  K64.0, First degree hemorrhoids                       K57.30, Diverticulosis of large intestine without                        perforation or abscess without bleeding CPT copyright 2018 American Medical Association. All rights reserved. The codes documented in this report are preliminary and upon coder review may  be revised to meet current compliance  requirements. Lollie Sails, MD 06/15/2018 10:22:45 AM This report has been signed electronically. Number of Addenda: 0 Note Initiated On: 06/15/2018 9:24 AM Scope Withdrawal Time: 0 hours 10 minutes 18 seconds  Total Procedure Duration: 0 hours 23 minutes 9 seconds       [facility]

## 2018-06-15 NOTE — Anesthesia Procedure Notes (Signed)
Performed by: Cook-Martin, Brittaney Beaulieu Pre-anesthesia Checklist: Patient identified, Emergency Drugs available, Suction available, Patient being monitored and Timeout performed Patient Re-evaluated:Patient Re-evaluated prior to induction Oxygen Delivery Method: Nasal cannula Preoxygenation: Pre-oxygenation with 100% oxygen Induction Type: IV induction Airway Equipment and Method: Bite block Placement Confirmation: CO2 detector and positive ETCO2       

## 2018-06-15 NOTE — Anesthesia Preprocedure Evaluation (Signed)
Anesthesia Evaluation  Patient identified by MRN, date of birth, ID band Patient awake    Reviewed: Allergy & Precautions, H&P , NPO status , Patient's Chart, lab work & pertinent test results, reviewed documented beta blocker date and time   Airway Mallampati: II   Neck ROM: full    Dental  (+) Poor Dentition   Pulmonary neg pulmonary ROS,    Pulmonary exam normal        Cardiovascular Exercise Tolerance: Good hypertension, On Medications negative cardio ROS Normal cardiovascular exam Rhythm:regular Rate:Normal     Neuro/Psych PSYCHIATRIC DISORDERS Anxiety Depression negative neurological ROS  negative psych ROS   GI/Hepatic negative GI ROS, Neg liver ROS, GERD  ,(+) Hepatitis -  Endo/Other  negative endocrine ROS  Renal/GU negative Renal ROS  negative genitourinary   Musculoskeletal   Abdominal   Peds  Hematology negative hematology ROS (+)   Anesthesia Other Findings Past Medical History: No date: Anxiety 05/2017: Cancer ([place])     Comment:  skin No date: Depression No date: GERD (gastroesophageal reflux disease) No date: Heartburn 1962: Hepatitis A No date: HLD (hyperlipidemia) No date: HTN (hypertension) No date: Parkinson disease ([place]) No date: Scoliosis No date: Vertigo     Comment:  rare Past Surgical History: 1977: ABDOMINAL HYSTERECTOMY 11/01/2017: CATARACT EXTRACTION W/PHACO; Left     Comment:  Procedure: CATARACT EXTRACTION PHACO AND INTRAOCULAR               LENS PLACEMENT ([place]) LEFT;  Surgeon: Leandrew Koyanagi, MD;  Location: [facility];  Service:               Ophthalmology;  Laterality: Left;   Reproductive/Obstetrics negative OB ROS                             Anesthesia Physical Anesthesia Plan  ASA: III  Anesthesia Plan: General   Post-op Pain Management:    Induction:   PONV Risk Score and Plan:   Airway Management  Planned:   Additional Equipment:   Intra-op Plan:   Post-operative Plan:   Informed Consent: I have reviewed the patients History and Physical, chart, labs and discussed the procedure including the risks, benefits and alternatives for the proposed anesthesia with the patient or authorized representative who has indicated his/her understanding and acceptance.     Dental Advisory Given  Plan Discussed with: CRNA  Anesthesia Plan Comments:         Anesthesia Quick Evaluation

## 2018-06-15 NOTE — Anesthesia Post-op Follow-up Note (Signed)
Anesthesia QCDR form completed.        

## 2018-06-15 NOTE — H&P (Signed)
Outpatient short stay form Pre-procedure 06/15/2018 9:12 AM Lollie Sails MD  Primary Physician: Ramonita Lab, MD  Reason for visit: EGD and colonoscopy  History of present illness: Patient is a 78 year old female presenting today for EGD and colonoscopy in regards colon cancer screening and symptoms of epigastric discomfort and dyspepsia.  Her last luminal evaluation is were in 2002.  Describes her upper abdominal discomfort, which extends about to the umbilicus, butterflies".  He has had some intermittent diarrhea with urgency.  There is no blood in the stool.  Has been taking omeprazole prior to being changed to pantoprazole in early December.  She tolerated her prep well.  She takes no aspirin or blood thinning agent.    Current Facility-Administered Medications:  .  0.9 %  sodium chloride infusion, , Intravenous, Continuous, Lollie Sails, MD, Last Rate: 20 mL/hr at 06/15/18 0805  Medications Prior to Admission  Medication Sig Dispense Refill Last Dose  . amLODipine (NORVASC) 5 MG tablet Take 2.5 mg by mouth as needed.   06/15/2018 at Unknown time  . alendronate (FOSAMAX) 70 MG tablet Take 70 mg by mouth once a week. Take with a full glass of water on an empty stomach.   Past Week at Unknown time  . carbidopa-levodopa (SINEMET IR) 25-250 MG tablet Take 2 tablets by mouth 4 (four) times daily. Pt a uses 25/200   11/01/2017 at Unknown time  . clobetasol ointment (TEMOVATE) 4.40 % Apply 1 application topically at bedtime as needed.   Past Week at Unknown time  . ezetimibe (ZETIA) 10 MG tablet Take by mouth.   11/01/2017 at Unknown time  . omeprazole (PRILOSEC) 20 MG capsule TAKE 1 CAPSULE DAILY   11/01/2017 at Unknown time  . Vitamin D, Ergocalciferol, (DRISDOL) 50000 units CAPS capsule TAKE 1 CAPSULE ONCE A WEEK   11/01/2017 at Unknown time     Allergies  Allergen Reactions  . Simvastatin Other (See Comments)    Muscle pain and wasting, liver function abnormalities  . Ultram  [Tramadol Hcl] Swelling  . Adhesive [Tape] Rash    Surgical tape     Past Medical History:  Diagnosis Date  . Anxiety   . Cancer ([place]) 05/2017   skin  . Depression   . GERD (gastroesophageal reflux disease)   . Heartburn   . Hepatitis A 1962  . HLD (hyperlipidemia)   . HTN (hypertension)   . Parkinson disease ([place])   . Scoliosis   . Vertigo    rare    Review of systems:      Physical Exam    Heart and lungs: Regular rate and rhythm without rub or gallop, lungs are bilaterally clear.    HEENT: Normocephalic atraumatic eyes are anicteric    Other:    Pertinant exam for procedure: Soft nontender nondistended bowel sounds positive normoactive    Planned proceedures: Colonoscopy and indicated procedures. I have discussed the risks benefits and complications of procedures to include not limited to bleeding, infection, perforation and the risk of sedation and the patient wishes to proceed.    Lollie Sails, MD Gastroenterology 06/15/2018  9:12 AM

## 2018-06-15 NOTE — Op Note (Signed)
[facility] Gastroenterology Patient Name: Julie Lynn Procedure Date: 06/15/2018 9:25 AM MRN: [id number] Account #: [id number] Date of Birth: 1940-08-31 Admit Type: Outpatient Age: 78 Room: [facility] ENDO ROOM 2 Gender: Female Note Status: Finalized Procedure:            Upper GI endoscopy Indications:          Epigastric abdominal pain, Functional Dyspepsia Providers:            Lollie Sails, MD Referring MD:         Ramonita Lab, MD (Referring MD) Medicines:            Monitored Anesthesia Care Complications:        No immediate complications. Procedure:            Pre-Anesthesia Assessment:                       - ASA Grade Assessment: III - A patient with severe                        systemic disease.                       After obtaining informed consent, the endoscope was                        passed under direct vision. Throughout the procedure,                        the patient's blood pressure, pulse, and oxygen                        saturations were monitored continuously. The Endoscope                        was introduced through the mouth, and advanced to the                        third part of duodenum. The upper GI endoscopy was                        accomplished without difficulty. The patient tolerated                        the procedure well. Findings:      LA Grade B (one or more mucosal breaks greater than 5 mm, not extending       between the tops of two mucosal folds) esophagitis with no bleeding was       found. Biopsies were taken with a cold forceps for histology.      The exam of the esophagus was otherwise normal.      A single 9 mm pedunculated polyp with inflamation and no stigmata of       recent bleeding was found on the anterior wall of the stomach. The polyp       was removed with a cold snare. Resection and retrieval were complete.      Multiple 2 to 5 mm pedunculated and sessile polyps with no bleeding and       no  stigmata of recent bleeding were found in the gastric fundus and in       the gastric body.  Biopsies were taken with a cold forceps for histology.      Patchy granular mucosa was found in the gastric antrum. Biopsies were       taken with a cold forceps for histology.      The cardia and gastric fundus were normal on retroflexion.      The in the duodenum was normal. Impression:           - LA Grade B erosive esophagitis. Biopsied.                       - A single gastric polyp. Resected and retrieved.                       - Multiple gastric polyps. Biopsied.                       - Granular gastric mucosa. Biopsied.                       - Normal. Recommendation:       - Use Protonix (pantoprazole) 40 mg PO daily daily.                       - Return to GI clinic in 1 month. Procedure Code(s):    --- Professional ---                       [phone], Esophagogastroduodenoscopy, flexible, transoral;                        with removal of tumor(s), polyp(s), or other lesion(s)                        by snare technique Diagnosis Code(s):    --- Professional ---                       K20.8, Other esophagitis                       K31.7, Polyp of stomach and duodenum                       K31.89, Other diseases of stomach and duodenum                       R10.13, Epigastric pain                       K30, Functional dyspepsia CPT copyright 2018 American Medical Association. All rights reserved. The codes documented in this report are preliminary and upon coder review may  be revised to meet current compliance requirements. Lollie Sails, MD 06/15/2018 9:52:01 AM This report has been signed electronically. Number of Addenda: 0 Note Initiated On: 06/15/2018 9:25 AM      [facility]

## 2018-06-15 NOTE — Transfer of Care (Signed)
Immediate Anesthesia Transfer of Care Note  Patient: Julie Lynn  Procedure(s) Performed: ESOPHAGOGASTRODUODENOSCOPY (EGD) WITH PROPOFOL (N/A ) COLONOSCOPY WITH PROPOFOL (N/A )  Patient Location: PACU  Anesthesia Type:General  Level of Consciousness: awake and sedated  Airway & Oxygen Therapy: Patient Spontanous Breathing and Patient connected to nasal cannula oxygen  Post-op Assessment: Report given to RN and Post -op Vital signs reviewed and stable  Post vital signs: Reviewed and stable  Last Vitals:  Vitals Value Taken Time  BP    Temp    Pulse    Resp    SpO2      Last Pain:  Vitals:   06/15/18 0748  TempSrc: Tympanic  PainSc: 3          Complications: No apparent anesthesia complications

## 2018-06-16 NOTE — Anesthesia Postprocedure Evaluation (Signed)
Anesthesia Post Note  Patient: Julie Lynn  Procedure(s) Performed: ESOPHAGOGASTRODUODENOSCOPY (EGD) WITH PROPOFOL (N/A ) COLONOSCOPY WITH PROPOFOL (N/A )  Patient location during evaluation: PACU Anesthesia Type: General Level of consciousness: awake and alert Pain management: pain level controlled Vital Signs Assessment: post-procedure vital signs reviewed and stable Respiratory status: spontaneous breathing, nonlabored ventilation, respiratory function stable and patient connected to nasal cannula oxygen Cardiovascular status: blood pressure returned to baseline and stable Postop Assessment: no apparent nausea or vomiting Anesthetic complications: no     Last Vitals:  Vitals:   06/15/18 1041 06/15/18 1051  BP: (!) 105/57 (!) 111/57  Pulse: 88 87  Resp: 13 18  Temp:    SpO2: 100% 100%    Last Pain:  Vitals:   06/15/18 1051  TempSrc:   PainSc: 0-No pain                 Molli Barrows

## 2018-06-18 ENCOUNTER — Encounter: Payer: Self-pay | Admitting: Gastroenterology

## 2018-06-18 LAB — SURGICAL PATHOLOGY

## 2018-10-29 ENCOUNTER — Other Ambulatory Visit: Payer: Self-pay | Admitting: Internal Medicine

## 2018-10-29 DIAGNOSIS — Z1231 Encounter for screening mammogram for malignant neoplasm of breast: Secondary | ICD-10-CM

## 2018-12-05 ENCOUNTER — Other Ambulatory Visit: Payer: Self-pay

## 2018-12-05 ENCOUNTER — Ambulatory Visit
Admission: RE | Admit: 2018-12-05 | Discharge: 2018-12-05 | Disposition: A | Discharge status: Home / Self Care | Payer: Medicare Other | Source: Ambulatory Visit | Attending: Internal Medicine | Admitting: Internal Medicine

## 2018-12-05 DIAGNOSIS — Z1231 Encounter for screening mammogram for malignant neoplasm of breast: Secondary | ICD-10-CM | POA: Insufficient documentation

## 2018-12-06 NOTE — Discharge Instructions (Signed)

## 2018-12-07 ENCOUNTER — Other Ambulatory Visit
Admission: RE | Admit: 2018-12-07 | Discharge: 2018-12-07 | Disposition: A | Discharge status: Home / Self Care | Payer: Medicare Other | Source: Ambulatory Visit | Attending: Ophthalmology | Admitting: Ophthalmology

## 2018-12-07 ENCOUNTER — Other Ambulatory Visit: Payer: Self-pay

## 2018-12-07 DIAGNOSIS — Z01812 Encounter for preprocedural laboratory examination: Secondary | ICD-10-CM | POA: Insufficient documentation

## 2018-12-07 DIAGNOSIS — Z20828 Contact with and (suspected) exposure to other viral communicable diseases: Secondary | ICD-10-CM | POA: Insufficient documentation

## 2018-12-07 LAB — SARS CORONAVIRUS 2 (TAT 6-24 HRS): SARS Coronavirus 2: NEGATIVE

## 2018-12-12 ENCOUNTER — Ambulatory Visit: Payer: Medicare Other | Admitting: Anesthesiology

## 2018-12-12 ENCOUNTER — Encounter
Admission: RE | Disposition: A | Discharge status: Home / Self Care | Payer: Self-pay | Source: Home / Self Care | Attending: Ophthalmology

## 2018-12-12 ENCOUNTER — Other Ambulatory Visit: Payer: Self-pay

## 2018-12-12 ENCOUNTER — Ambulatory Visit
Admission: RE | Admit: 2018-12-12 | Discharge: 2018-12-12 | Disposition: A | Discharge status: Home / Self Care | Payer: Medicare Other | Attending: Ophthalmology | Admitting: Ophthalmology

## 2018-12-12 DIAGNOSIS — I10 Essential (primary) hypertension: Secondary | ICD-10-CM | POA: Insufficient documentation

## 2018-12-12 DIAGNOSIS — K219 Gastro-esophageal reflux disease without esophagitis: Secondary | ICD-10-CM | POA: Insufficient documentation

## 2018-12-12 DIAGNOSIS — G20 Parkinson's disease: Secondary | ICD-10-CM | POA: Diagnosis not present

## 2018-12-12 DIAGNOSIS — Z79899 Other long term (current) drug therapy: Secondary | ICD-10-CM | POA: Insufficient documentation

## 2018-12-12 DIAGNOSIS — H2511 Age-related nuclear cataract, right eye: Secondary | ICD-10-CM | POA: Insufficient documentation

## 2018-12-12 HISTORY — PX: CATARACT EXTRACTION W/PHACO: SHX586

## 2018-12-12 SURGERY — PHACOEMULSIFICATION, CATARACT, WITH IOL INSERTION
Anesthesia: Monitor Anesthesia Care | Site: Eye | Laterality: Right

## 2018-12-12 MED ORDER — LIDOCAINE HCL (PF) 2 % IJ SOLN
INTRAOCULAR | Status: DC | PRN
  Administered 2018-12-12: 1 mL

## 2018-12-12 MED ORDER — TETRACAINE HCL 0.5 % OP SOLN
1.0000 [drp] | OPHTHALMIC | Status: DC | PRN
  Administered 2018-12-12 (×3): 1 [drp] via OPHTHALMIC

## 2018-12-12 MED ORDER — FENTANYL CITRATE (PF) 100 MCG/2ML IJ SOLN
INTRAMUSCULAR | Status: DC | PRN
  Administered 2018-12-12: 50 ug via INTRAVENOUS

## 2018-12-12 MED ORDER — NA HYALUR & NA CHOND-NA HYALUR 0.4-0.35 ML IO KIT
PACK | INTRAOCULAR | Status: DC | PRN
  Administered 2018-12-12: 1 mL via INTRAOCULAR

## 2018-12-12 MED ORDER — MOXIFLOXACIN HCL 0.5 % OP SOLN
1.0000 [drp] | OPHTHALMIC | Status: DC | PRN
  Administered 2018-12-12 (×3): 1 [drp] via OPHTHALMIC

## 2018-12-12 MED ORDER — EPINEPHRINE PF 1 MG/ML IJ SOLN
INTRAOCULAR | Status: DC | PRN
  Administered 2018-12-12: 56 mL via OPHTHALMIC

## 2018-12-12 MED ORDER — ARMC OPHTHALMIC DILATING DROPS
1.0000 "application " | OPHTHALMIC | Status: DC | PRN
  Administered 2018-12-12 (×3): 1 via OPHTHALMIC

## 2018-12-12 MED ORDER — CEFUROXIME OPHTHALMIC INJECTION 1 MG/0.1 ML
INJECTION | OPHTHALMIC | Status: DC | PRN
  Administered 2018-12-12: 0.1 mL via INTRACAMERAL

## 2018-12-12 MED ORDER — BRIMONIDINE TARTRATE-TIMOLOL 0.2-0.5 % OP SOLN
OPHTHALMIC | Status: DC | PRN
  Administered 2018-12-12: 1 [drp] via OPHTHALMIC

## 2018-12-12 MED ORDER — NEOMYCIN-POLYMYXIN-DEXAMETH 3.5-10000-0.1 OP OINT
TOPICAL_OINTMENT | OPHTHALMIC | Status: DC | PRN
  Administered 2018-12-12: 1 via OPHTHALMIC

## 2018-12-12 MED ORDER — MIDAZOLAM HCL 2 MG/2ML IJ SOLN
INTRAMUSCULAR | Status: DC | PRN
  Administered 2018-12-12: 1 mg via INTRAVENOUS

## 2018-12-12 SURGICAL SUPPLY — 26 items
CANNULA ANT/CHMB 27G (MISCELLANEOUS) ×1 IMPLANT
CANNULA ANT/CHMB 27GA (MISCELLANEOUS) ×3 IMPLANT
GLOVE SURG LX 7.5 STRW (GLOVE) ×4
GLOVE SURG LX STRL 7.5 STRW (GLOVE) ×1 IMPLANT
GLOVE SURG TRIUMPH 8.0 PF LTX (GLOVE) ×3 IMPLANT
GOWN STRL REUS W/ TWL LRG LVL3 (GOWN DISPOSABLE) ×2 IMPLANT
GOWN STRL REUS W/TWL LRG LVL3 (GOWN DISPOSABLE) ×4
LENS IOL TECNIS ITEC 21.0 (Intraocular Lens) ×2 IMPLANT
MARKER SKIN DUAL TIP RULER LAB (MISCELLANEOUS) ×3 IMPLANT
NDL FILTER BLUNT 18X1 1/2 (NEEDLE) ×1 IMPLANT
NDL RETROBULBAR .5 NSTRL (NEEDLE) IMPLANT
NEEDLE FILTER BLUNT 18X 1/2SAF (NEEDLE) ×2
NEEDLE FILTER BLUNT 18X1 1/2 (NEEDLE) ×1 IMPLANT
PACK CATARACT BRASINGTON (MISCELLANEOUS) ×3 IMPLANT
PACK EYE AFTER SURG (MISCELLANEOUS) ×3 IMPLANT
PACK OPTHALMIC (MISCELLANEOUS) ×3 IMPLANT
RING MALYGIN 7.0 (MISCELLANEOUS) IMPLANT
SUT ETHILON 10-0 CS-B-6CS-B-6 (SUTURE)
SUT VICRYL  9 0 (SUTURE)
SUT VICRYL 9 0 (SUTURE) IMPLANT
SUTURE EHLN 10-0 CS-B-6CS-B-6 (SUTURE) IMPLANT
SYR 3ML LL SCALE MARK (SYRINGE) ×3 IMPLANT
SYR 5ML LL (SYRINGE) ×3 IMPLANT
SYR TB 1ML LUER SLIP (SYRINGE) ×3 IMPLANT
WATER STERILE IRR 500ML POUR (IV SOLUTION) ×3 IMPLANT
WIPE NON LINTING 3.25X3.25 (MISCELLANEOUS) ×3 IMPLANT

## 2018-12-12 NOTE — H&P (Signed)

## 2018-12-12 NOTE — Anesthesia Procedure Notes (Signed)
Procedure Name: MAC Performed by: Oronde Hallenbeck, CRNA Pre-anesthesia Checklist: Patient identified, Emergency Drugs available, Suction available, Timeout performed and Patient being monitored Patient Re-evaluated:Patient Re-evaluated prior to induction Oxygen Delivery Method: Nasal cannula Placement Confirmation: positive ETCO2       

## 2018-12-12 NOTE — Anesthesia Preprocedure Evaluation (Signed)
Anesthesia Evaluation  Patient identified by MRN, date of birth, ID band Patient awake    Reviewed: Allergy & Precautions, H&P , NPO status , Patient's Chart, lab work & pertinent test results  History of Anesthesia Complications Negative for: history of anesthetic complications  Airway Mallampati: II  TM Distance: >3 FB Neck ROM: full    Dental no notable dental hx.    Pulmonary neg pulmonary ROS,    Pulmonary exam normal breath sounds clear to auscultation       Cardiovascular hypertension, On Medications Normal cardiovascular exam     Neuro/Psych Parkinson's disease.    GI/Hepatic GERD  ,  Endo/Other    Renal/GU      Musculoskeletal   Abdominal   Peds  Hematology   Anesthesia Other Findings   Reproductive/Obstetrics                             Anesthesia Physical Anesthesia Plan  ASA: III  Anesthesia Plan: MAC   Post-op Pain Management:    Induction:   PONV Risk Score and Plan:   Airway Management Planned:   Additional Equipment:   Intra-op Plan:   Post-operative Plan:   Informed Consent: I have reviewed the patients History and Physical, chart, labs and discussed the procedure including the risks, benefits and alternatives for the proposed anesthesia with the patient or authorized representative who has indicated his/her understanding and acceptance.       Plan Discussed with:   Anesthesia Plan Comments:         Anesthesia Quick Evaluation

## 2018-12-12 NOTE — Transfer of Care (Signed)
Immediate Anesthesia Transfer of Care Note  Patient: Julie Lynn  Procedure(s) Performed: CATARACT EXTRACTION PHACO AND INTRAOCULAR LENS PLACEMENT ([facility])  RIGHT (Right Eye)  Patient Location: PACU  Anesthesia Type: MAC  Level of Consciousness: awake, alert  and patient cooperative  Airway and Oxygen Therapy: Patient Spontanous Breathing and Patient connected to supplemental oxygen  Post-op Assessment: Post-op Vital signs reviewed, Patient's Cardiovascular Status Stable, Respiratory Function Stable, Patent Airway and No signs of Nausea or vomiting  Post-op Vital Signs: Reviewed and stable  Complications: No apparent anesthesia complications

## 2018-12-12 NOTE — Anesthesia Postprocedure Evaluation (Signed)
Anesthesia Post Note  Patient: Julie Lynn  Procedure(s) Performed: CATARACT EXTRACTION PHACO AND INTRAOCULAR LENS PLACEMENT ([facility])  RIGHT (Right Eye)  Patient location during evaluation: PACU Anesthesia Type: MAC Level of consciousness: awake and alert Pain management: pain level controlled Vital Signs Assessment: post-procedure vital signs reviewed and stable Respiratory status: spontaneous breathing Cardiovascular status: stable Anesthetic complications: no    Markian Glockner, III,  Claritza July D

## 2018-12-12 NOTE — Op Note (Signed)
LOCATION:  [facility]   PREOPERATIVE DIAGNOSIS:    Nuclear sclerotic cataract right eye. H25.11   POSTOPERATIVE DIAGNOSIS:  Nuclear sclerotic cataract right eye.     PROCEDURE:  Phacoemusification with posterior chamber intraocular lens placement of the right eye   LENS:  PCB00 21.0 D PCIOL   ULTRASOUND TIME: 9 % of 1 minutes, 3 seconds.  CDE 5.4   SURGEON:  Wyonia Hough, MD   ANESTHESIA:  Topical with tetracaine drops and 2% Xylocaine jelly, augmented with 1% preservative-free intracameral lidocaine.    COMPLICATIONS:  None.   DESCRIPTION OF PROCEDURE:  The patient was identified in the holding room and transported to the operating room and placed in the supine position under the operating microscope.  The right eye was identified as the operative eye and it was prepped and draped in the usual sterile ophthalmic fashion.   A 1 millimeter clear-corneal paracentesis was made at the 12:00 position.  0.5 ml of preservative-free 1% lidocaine was injected into the anterior chamber. The anterior chamber was filled with Viscoat viscoelastic.  A 2.4 millimeter keratome was used to make a near-clear corneal incision at the 9:00 position.  A curvilinear capsulorrhexis was made with a cystotome and capsulorrhexis forceps.  Balanced salt solution was used to hydrodissect and hydrodelineate the nucleus.   Phacoemulsification was then used in stop and chop fashion to remove the lens nucleus and epinucleus.  The remaining cortex was then removed using the irrigation and aspiration handpiece. Provisc was then placed into the capsular bag to distend it for lens placement.  A lens was then injected into the capsular bag.  The remaining viscoelastic was aspirated.   Wounds were hydrated with balanced salt solution.  The anterior chamber was inflated to a physiologic pressure with balanced salt solution.  No wound leaks were noted. Cefuroxime 0.1 ml of a 10mg /ml solution was injected into  the anterior chamber for a dose of 1 mg of intracameral antibiotic at the completion of the case.   Timolol and Brimonidine drops were applied to the eye.  The patient was taken to the recovery room in stable condition without complications of anesthesia or surgery.   Julie Lynn 12/12/2018, 11:14 AM

## 2018-12-13 ENCOUNTER — Encounter: Payer: Self-pay | Admitting: Ophthalmology

## 2019-12-27 ENCOUNTER — Other Ambulatory Visit: Payer: Self-pay | Admitting: Internal Medicine

## 2019-12-27 DIAGNOSIS — Z1231 Encounter for screening mammogram for malignant neoplasm of breast: Secondary | ICD-10-CM

## 2020-01-06 ENCOUNTER — Ambulatory Visit
Admission: RE | Admit: 2020-01-06 | Discharge: 2020-01-06 | Disposition: A | Discharge status: Home / Self Care | Payer: Medicare Other | Source: Ambulatory Visit | Attending: Internal Medicine | Admitting: Internal Medicine

## 2020-01-06 ENCOUNTER — Other Ambulatory Visit: Payer: Self-pay

## 2020-01-06 DIAGNOSIS — Z1231 Encounter for screening mammogram for malignant neoplasm of breast: Secondary | ICD-10-CM

## 2020-01-09 ENCOUNTER — Other Ambulatory Visit: Payer: Self-pay | Admitting: Internal Medicine

## 2020-01-09 DIAGNOSIS — N6489 Other specified disorders of breast: Secondary | ICD-10-CM

## 2020-01-09 DIAGNOSIS — R928 Other abnormal and inconclusive findings on diagnostic imaging of breast: Secondary | ICD-10-CM

## 2020-01-18 IMAGING — MG MM DIGITAL SCREENING BILAT W/ TOMO W/ CAD
9 of 12 series · 9 of 28 positions shown · non-contrast
Comparison: Previous exam(s).

CLINICAL DATA: Screening.

EXAM:
DIGITAL SCREENING BILATERAL MAMMOGRAM WITH TOMO AND CAD

[L CC synth-2D]
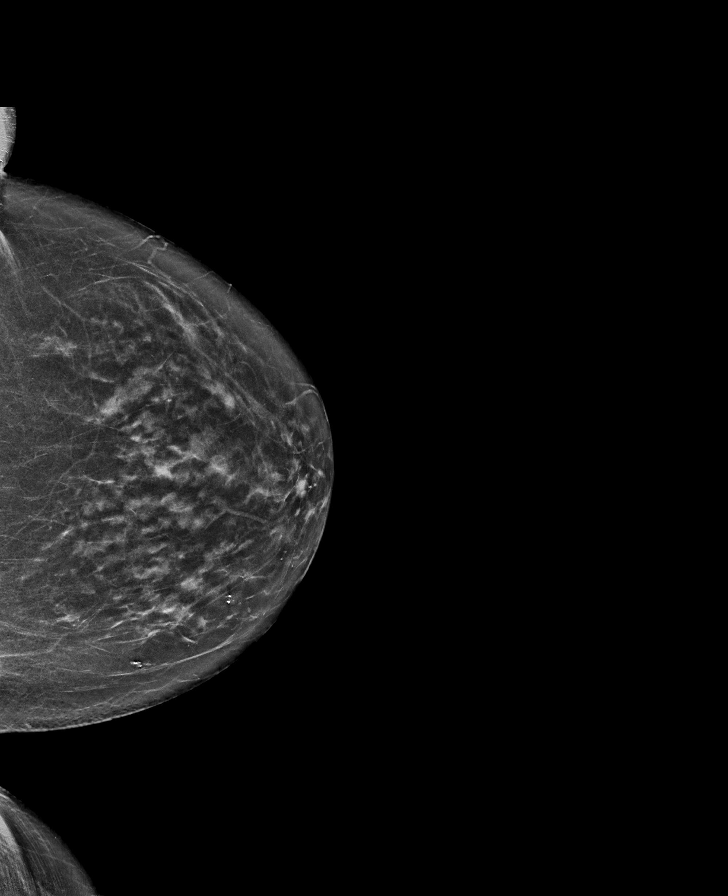

[R MLO synth-2D]
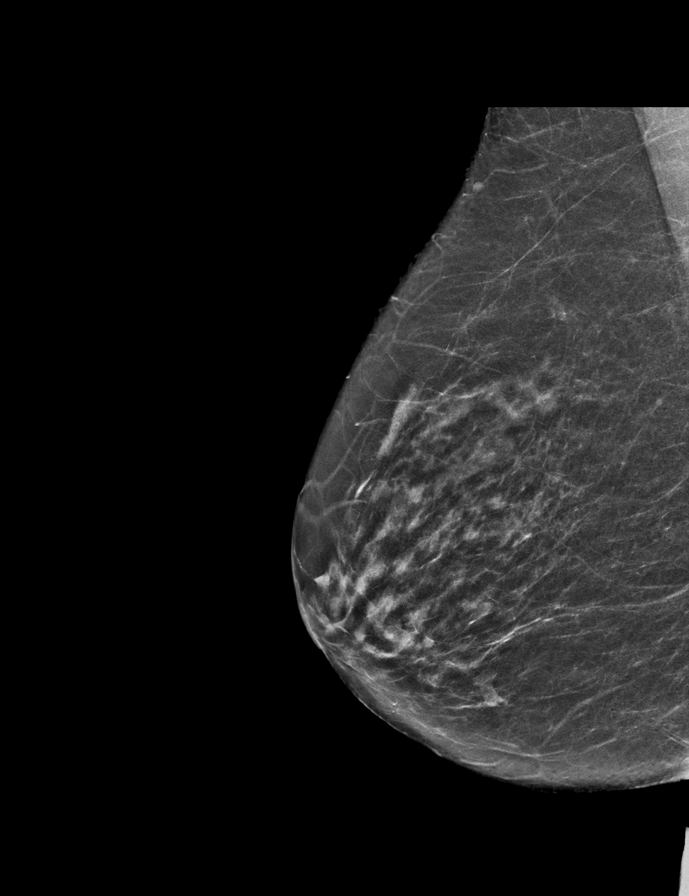

[L MLO]
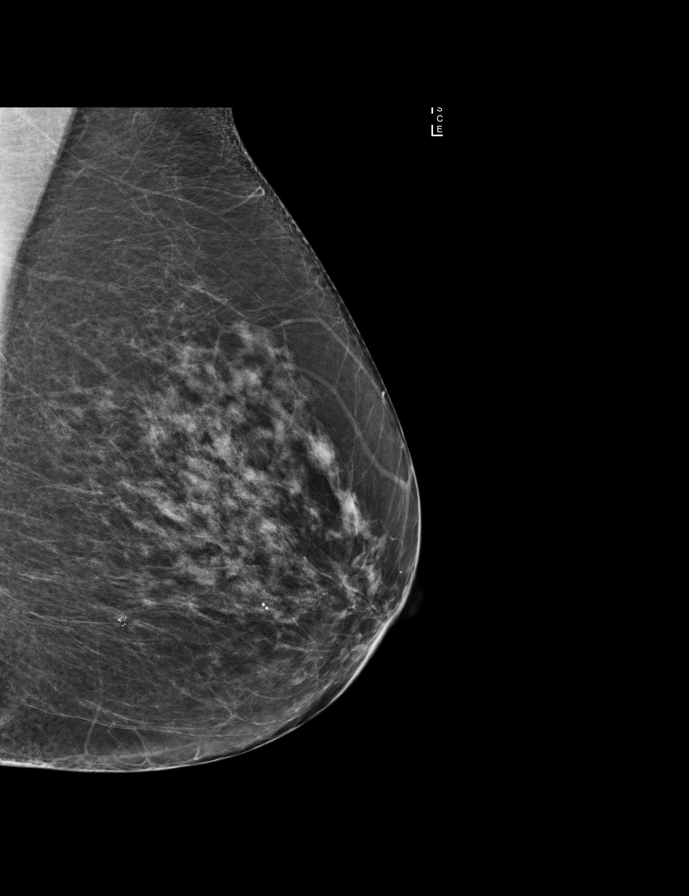

[L MLO synth-2D]
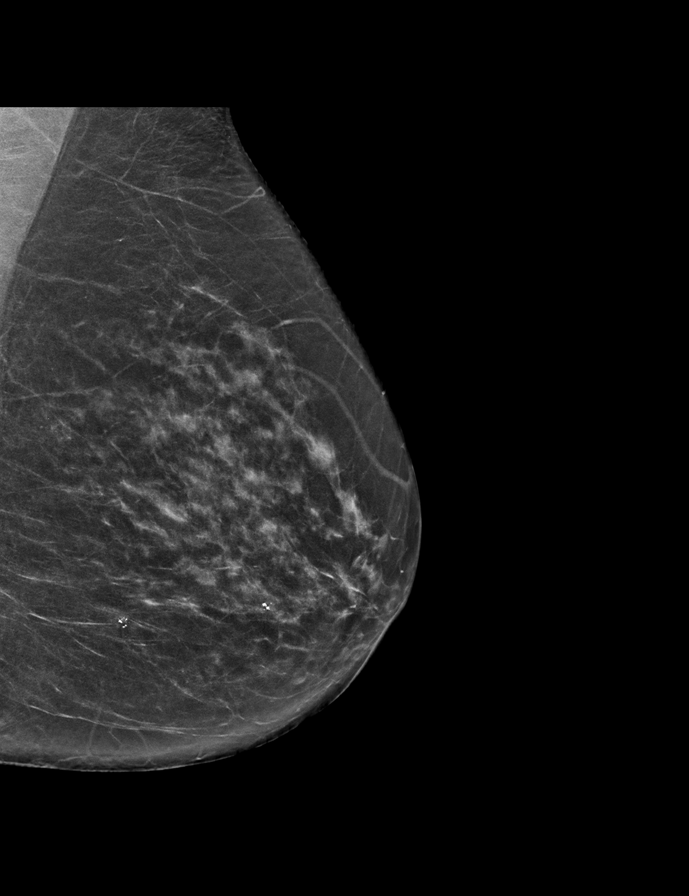

[R CC]
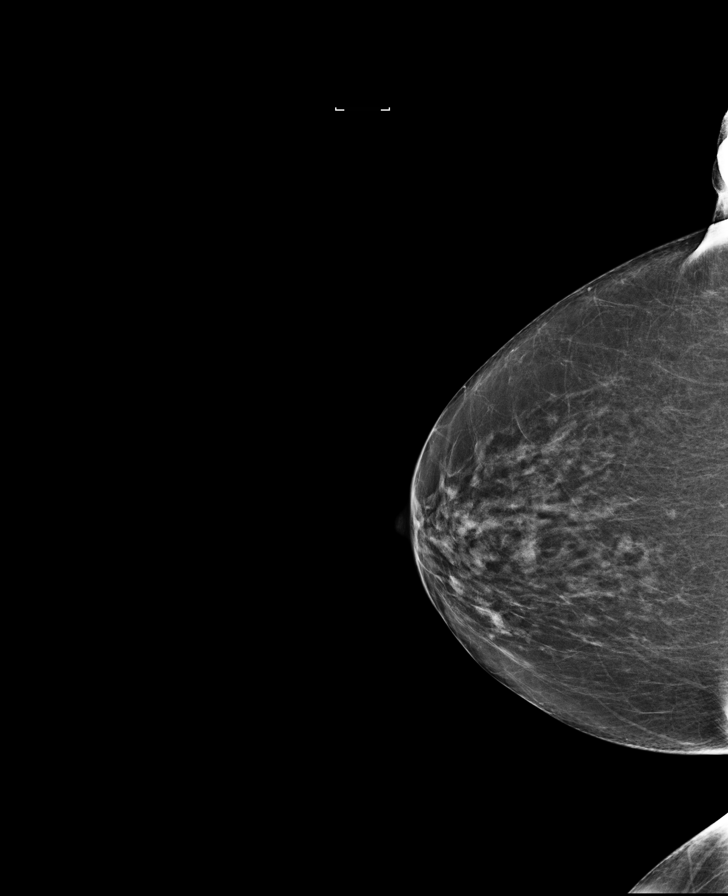

[R CC synth-2D]
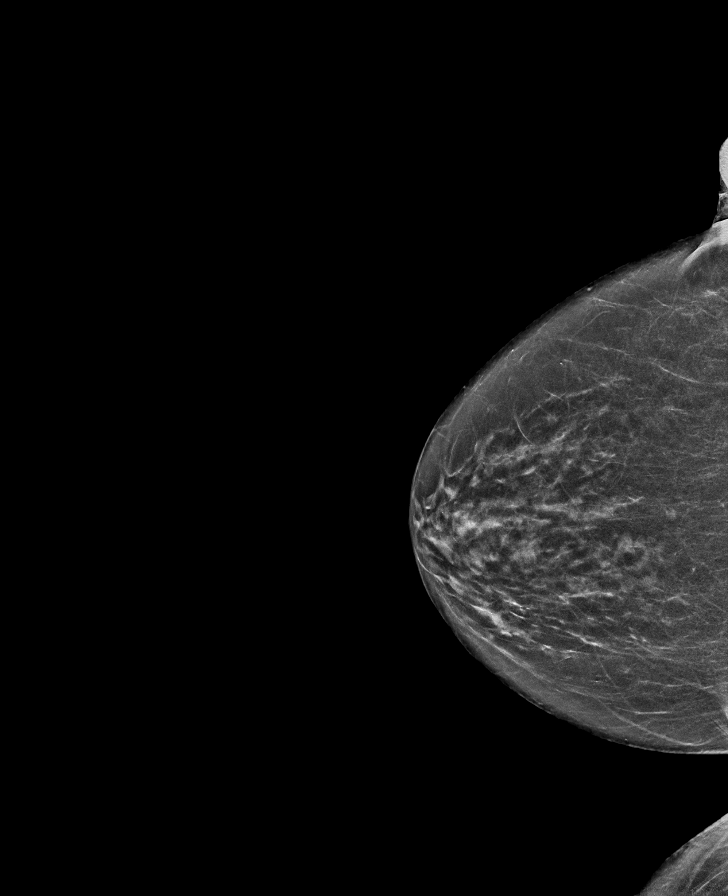

[L CC]
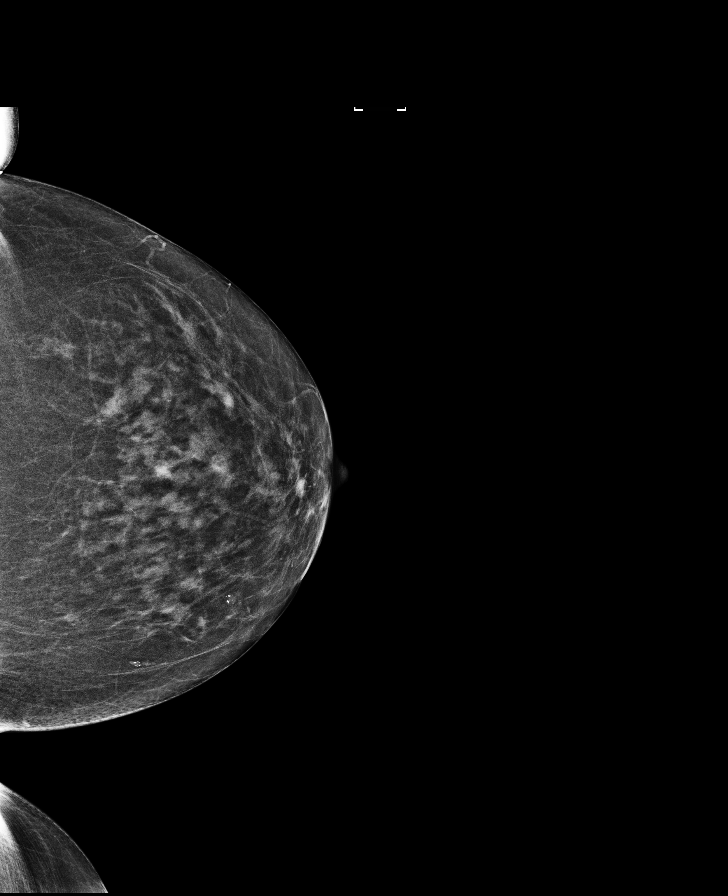

[R MLO]
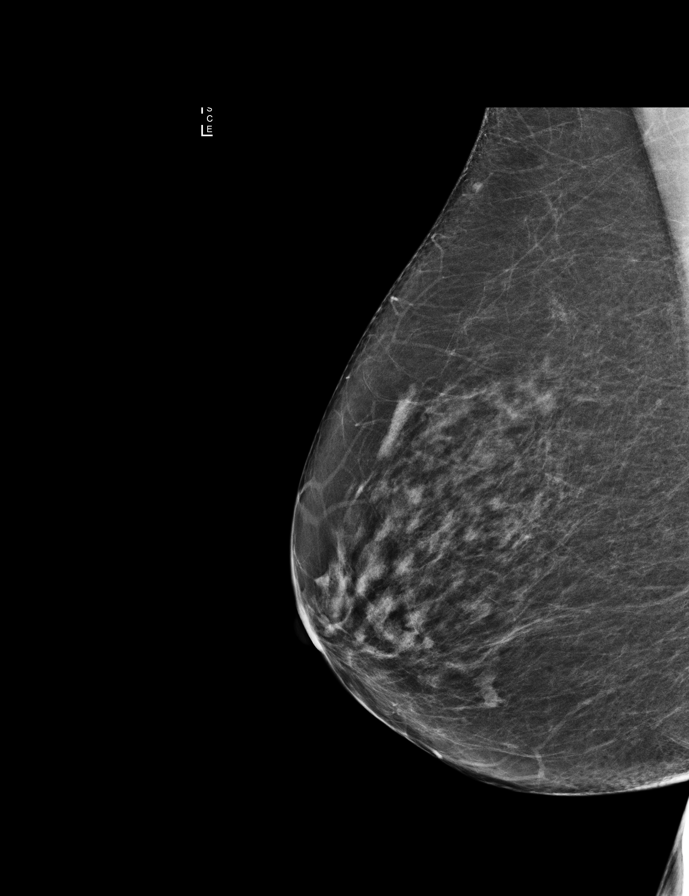

[R CC tomo · tomo slice 29/56.0]
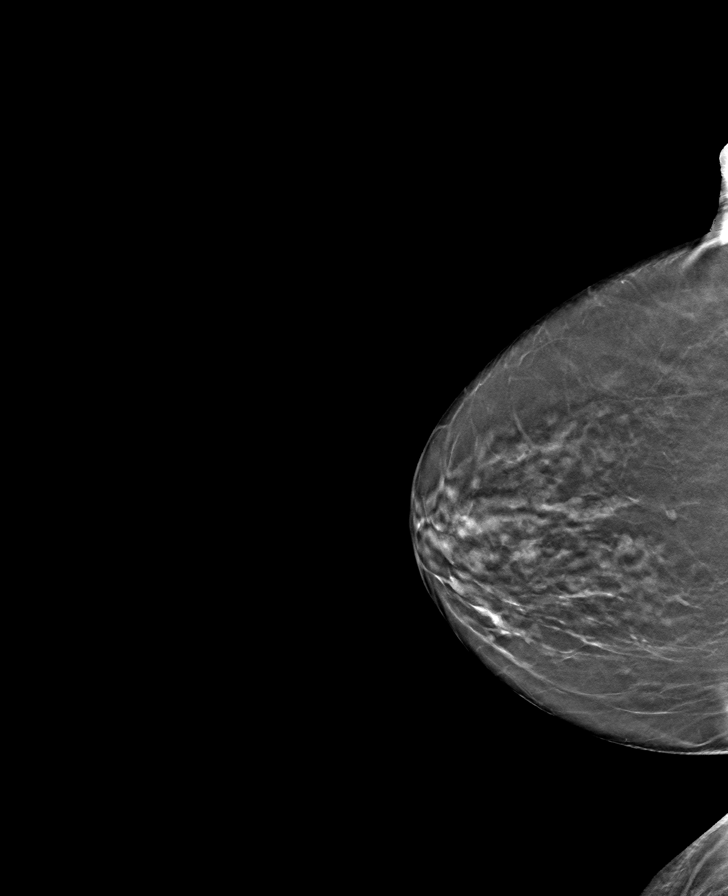

[9 of 28 positions shown; findings below may reference images not displayed]

ACR Breast Density Category b: There are scattered areas of
fibroglandular density.
FINDINGS: There are no findings suspicious for malignancy. Images were
processed with CAD.
IMPRESSION: No mammographic evidence of malignancy. A result letter of this
screening mammogram will be mailed directly to the patient.

RECOMMENDATION:
Screening mammogram in one year. (Code:[id number])

BI-RADS CATEGORY  1: Negative.

## 2020-01-22 ENCOUNTER — Other Ambulatory Visit: Payer: Self-pay

## 2020-01-22 ENCOUNTER — Ambulatory Visit
Admission: RE | Admit: 2020-01-22 | Discharge: 2020-01-22 | Disposition: A | Discharge status: Home / Self Care | Payer: Medicare Other | Source: Ambulatory Visit | Attending: Internal Medicine | Admitting: Internal Medicine

## 2020-01-22 DIAGNOSIS — N6489 Other specified disorders of breast: Secondary | ICD-10-CM

## 2020-01-22 DIAGNOSIS — R928 Other abnormal and inconclusive findings on diagnostic imaging of breast: Secondary | ICD-10-CM | POA: Diagnosis not present

## 2020-03-25 ENCOUNTER — Other Ambulatory Visit: Payer: Self-pay

## 2020-03-25 ENCOUNTER — Encounter: Payer: Self-pay | Admitting: Emergency Medicine

## 2020-03-25 ENCOUNTER — Ambulatory Visit
Admission: EM | Admit: 2020-03-25 | Discharge: 2020-03-25 | Disposition: A | Discharge status: Home / Self Care | Payer: Medicare Other

## 2020-03-25 DIAGNOSIS — T148XXA Other injury of unspecified body region, initial encounter: Secondary | ICD-10-CM

## 2020-03-25 NOTE — ED Triage Notes (Signed)
Patient has a skin tear to the top of her left hand. She states she was walking her dog and the leash got wrapped around a chair and pulled her hand.

## 2020-03-25 NOTE — ED Provider Notes (Signed)
[facility]    CSN: [id number] Arrival date & time: 03/25/20  1811      History   Chief Complaint Chief Complaint  Patient presents with  . skin tear    HPI Julie Lynn is a 79 y.o. female presenting for 2 skin tears of the left dorsal hand.  Patient states that the injury occurred immediately prior to arrival to [facility] urgent care.  She states that she got her dog's leash wrapped around a chair and that pulled her hand and ripped her skin.  Patient says that this was bleeding a little but has calm down now.  She has cleaned it.  She is up-to-date with tetanus.  Patient's past medical history significant for Parkinson's disease, hypertension, hyperlipidemia, skin cancer and anxiety.  Patient does not take any anticoagulants.  She has no other complaints or concerns.  HPI  Past Medical History:  Diagnosis Date  . Anxiety   . Cancer ([place]) 05/2017   skin  . Depression   . GERD (gastroesophageal reflux disease)   . Heartburn   . Hepatitis A 1962  . HLD (hyperlipidemia)   . HTN (hypertension)   . Parkinson disease ([place])   . Scoliosis   . Vertigo    rare    There are no problems to display for this patient.   Past Surgical History:  Procedure Laterality Date  . ABDOMINAL HYSTERECTOMY  1977  . CATARACT EXTRACTION W/PHACO Left 11/01/2017   Procedure: CATARACT EXTRACTION PHACO AND INTRAOCULAR LENS PLACEMENT ([place]) LEFT;  Surgeon: Leandrew Koyanagi, MD;  Location: [place];  Service: Ophthalmology;  Laterality: Left;  . CATARACT EXTRACTION W/PHACO Right 12/12/2018   Procedure: CATARACT EXTRACTION PHACO AND INTRAOCULAR LENS PLACEMENT ([place])  RIGHT;  Surgeon: Leandrew Koyanagi, MD;  Location: [place];  Service: Ophthalmology;  Laterality: Right;  . COLONOSCOPY WITH PROPOFOL N/A 06/15/2018   Procedure: COLONOSCOPY WITH PROPOFOL;  Surgeon: Lollie Sails, MD;  Location: [facility] ENDOSCOPY;  Service: Endoscopy;  Laterality: N/A;  .  ESOPHAGOGASTRODUODENOSCOPY (EGD) WITH PROPOFOL N/A 06/15/2018   Procedure: ESOPHAGOGASTRODUODENOSCOPY (EGD) WITH PROPOFOL;  Surgeon: Lollie Sails, MD;  Location: [facility] ENDOSCOPY;  Service: Endoscopy;  Laterality: N/A;    OB History   No obstetric history on file.      Home Medications    Prior to Admission medications   Medication Sig Start Date End Date Taking? Authorizing Provider  alendronate (FOSAMAX) 70 MG tablet Take 70 mg by mouth once a week. Take with a full glass of water on an empty stomach.    [provider]  amLODipine (NORVASC) 5 MG tablet Take 2.5 mg by mouth as needed.    [provider]  busPIRone (BUSPAR) 15 MG tablet Take 15 mg by mouth 2 (two) times daily.    [provider]  carbidopa-levodopa (SINEMET IR) 25-100 MG tablet Take 1 tablet by mouth at bedtime.    [provider]  carbidopa-levodopa (SINEMET IR) 25-250 MG tablet Take 2 tablets by mouth 4 (four) times daily. Pt a uses 25/200    [provider]  clobetasol ointment (TEMOVATE) 4.09 % Apply 1 application topically at bedtime as needed.    [provider]  ezetimibe (ZETIA) 10 MG tablet Take by mouth. 07/19/16   [provider]  omeprazole (PRILOSEC) 20 MG capsule TAKE 1 CAPSULE DAILY 05/24/16   [provider]  Vitamin D, Ergocalciferol, (DRISDOL) 50000 units CAPS capsule TAKE 1 CAPSULE ONCE A WEEK 03/08/16   [provider]    Family History Family History  Problem Relation Age of Onset  . Breast cancer Mother 78  . Kidney cancer Neg Hx   . Bladder Cancer Neg Hx     Social History Social History   Tobacco Use  . Smoking status: Never Smoker  . Smokeless tobacco: Never Used  Vaping Use  . Vaping Use: Never used  Substance Use Topics  . Alcohol use: No  . Drug use: No     Allergies   Simvastatin, Ultram [tramadol hcl], and Adhesive [tape]   Review of Systems Review of Systems  Musculoskeletal: Negative  for arthralgias and joint swelling.  Skin: Positive for color change (bruising) and wound.  Neurological: Positive for tremors. Negative for weakness and numbness.  Hematological: Does not bruise/bleed easily.     Physical Exam Triage Vital Signs ED Triage Vitals  Enc Vitals Group     BP 03/25/20 1824 (!) 178/89     Pulse Rate 03/25/20 1824 78     Resp 03/25/20 1824 18     Temp 03/25/20 1824 98.2 F (36.8 C)     Temp Source 03/25/20 1824 Oral     SpO2 03/25/20 1824 99 %     Weight 03/25/20 1820 120 lb (54.4 kg)     Height 03/25/20 1820 5' (1.524 m)     Head Circumference --      Peak Flow --      Pain Score 03/25/20 1820 1     Pain Loc --      Pain Edu? --      Excl. in [place]? --    No data found.  Updated Vital Signs BP (!) 163/89 (BP Location: Right Arm)   Pulse 78   Temp 98.2 F (36.8 C) (Oral)   Resp 18   Ht 5' (1.524 m)   Wt 120 lb (54.4 kg)   LMP  (LMP Unknown)   SpO2 99%   BMI 23.44 kg/m    Physical Exam Vitals and nursing note reviewed.  Constitutional:      General: She is not in acute distress.    Appearance: Normal appearance. She is not ill-appearing or toxic-appearing.  HENT:     Head: Normocephalic and atraumatic.  Eyes:     General: No scleral icterus.       Right eye: No discharge.        Left eye: No discharge.     Conjunctiva/sclera: Conjunctivae normal.  Cardiovascular:     Rate and Rhythm: Normal rate.     Pulses: Normal pulses.  Pulmonary:     Effort: Pulmonary effort is normal. No respiratory distress.  Musculoskeletal:       Arms:     Cervical back: Neck supple.  Skin:    General: Skin is dry.     Findings: Lesion (2 separate lacerations of dorsal left hand ~1.5 cm each. See image above) present.  Neurological:     General: No focal deficit present.     Mental Status: She is alert. Mental status is at baseline.     Motor: No weakness.     Gait: Gait normal.     Comments: Resting tremors of hands present  Psychiatric:         Mood and Affect: Mood normal.        Behavior: Behavior normal.        Thought Content: Thought content normal.      UC Treatments / Results  Labs (all labs  ordered are listed, but only abnormal results are displayed) Labs Reviewed - No data to display  EKG   Radiology No results found.  Procedures Procedures (including critical care time)  Medications Ordered in UC Medications - No data to display  Initial Impression / Assessment and Plan / UC Course  I have reviewed the triage vital signs and the nursing notes.  Pertinent labs & imaging results that were available during my care of the patient were reviewed by me and considered in my medical decision making (see chart for details).   Skin tears cleansed with normal saline.  Applied 3 long Steri-Strips to each of the skin tears with good wound approximation.  Then applied nonadherent dressing and Coban.  Patient states that she is not any allergies to any of these products.  Discussed wound care with patient.  Advised to follow-up with her clinic as needed for new or worsening symptoms or any signs of infection.  BP elevated in clinic.  Advised to keep a log and follow-up with PCP if she has consistently elevated blood pressures.  Final Clinical Impressions(s) / UC Diagnoses   Final diagnoses:  Multiple skin tears   Discharge Instructions   None    ED Prescriptions    None     PDMP not reviewed this encounter.   Danton Clap, PA-C 03/25/20 1856

## 2021-01-07 ENCOUNTER — Other Ambulatory Visit: Payer: Self-pay | Admitting: Internal Medicine

## 2021-01-07 DIAGNOSIS — Z1231 Encounter for screening mammogram for malignant neoplasm of breast: Secondary | ICD-10-CM

## 2021-01-18 ENCOUNTER — Other Ambulatory Visit: Payer: Self-pay

## 2021-01-18 ENCOUNTER — Ambulatory Visit
Admission: RE | Admit: 2021-01-18 | Discharge: 2021-01-18 | Disposition: A | Discharge status: Home / Self Care | Payer: Medicare Other | Source: Ambulatory Visit | Attending: Internal Medicine | Admitting: Internal Medicine

## 2021-01-18 DIAGNOSIS — Z1231 Encounter for screening mammogram for malignant neoplasm of breast: Secondary | ICD-10-CM | POA: Insufficient documentation

## 2021-04-20 ENCOUNTER — Other Ambulatory Visit: Payer: Self-pay

## 2021-04-20 ENCOUNTER — Encounter: Payer: Self-pay | Admitting: Emergency Medicine

## 2021-04-20 ENCOUNTER — Ambulatory Visit
Admission: EM | Admit: 2021-04-20 | Discharge: 2021-04-20 | Disposition: A | Discharge status: Home / Self Care | Payer: Medicare Other | Attending: Family | Admitting: Family

## 2021-04-20 DIAGNOSIS — I10 Essential (primary) hypertension: Secondary | ICD-10-CM | POA: Diagnosis not present

## 2021-04-20 DIAGNOSIS — K219 Gastro-esophageal reflux disease without esophagitis: Secondary | ICD-10-CM | POA: Diagnosis not present

## 2021-04-20 DIAGNOSIS — R109 Unspecified abdominal pain: Secondary | ICD-10-CM | POA: Diagnosis not present

## 2021-04-20 DIAGNOSIS — R0981 Nasal congestion: Secondary | ICD-10-CM | POA: Insufficient documentation

## 2021-04-20 DIAGNOSIS — W19XXXA Unspecified fall, initial encounter: Secondary | ICD-10-CM | POA: Diagnosis not present

## 2021-04-20 DIAGNOSIS — R051 Acute cough: Secondary | ICD-10-CM | POA: Diagnosis not present

## 2021-04-20 DIAGNOSIS — G20 Parkinson's disease: Secondary | ICD-10-CM | POA: Diagnosis not present

## 2021-04-20 DIAGNOSIS — R519 Headache, unspecified: Secondary | ICD-10-CM | POA: Insufficient documentation

## 2021-04-20 DIAGNOSIS — Z20822 Contact with and (suspected) exposure to covid-19: Secondary | ICD-10-CM | POA: Diagnosis not present

## 2021-04-20 DIAGNOSIS — E785 Hyperlipidemia, unspecified: Secondary | ICD-10-CM | POA: Insufficient documentation

## 2021-04-20 DIAGNOSIS — R059 Cough, unspecified: Secondary | ICD-10-CM | POA: Diagnosis present

## 2021-04-20 DIAGNOSIS — Z79899 Other long term (current) drug therapy: Secondary | ICD-10-CM | POA: Insufficient documentation

## 2021-04-20 DIAGNOSIS — H6983 Other specified disorders of Eustachian tube, bilateral: Secondary | ICD-10-CM

## 2021-04-20 DIAGNOSIS — F419 Anxiety disorder, unspecified: Secondary | ICD-10-CM | POA: Diagnosis not present

## 2021-04-20 DIAGNOSIS — R42 Dizziness and giddiness: Secondary | ICD-10-CM | POA: Diagnosis not present

## 2021-04-20 DIAGNOSIS — H6993 Unspecified Eustachian tube disorder, bilateral: Secondary | ICD-10-CM | POA: Insufficient documentation

## 2021-04-20 MED ORDER — MECLIZINE HCL 12.5 MG PO TABS: 12.5000 mg | ORAL_TABLET | Freq: Three times a day (TID) | ORAL | 0 refills | Status: DC | PRN

## 2021-04-20 NOTE — ED Triage Notes (Signed)
Pt presents today with daughter. Pt c/o of cough and congestion x 2 days. Daughter states she fell twice since yesterday after feeling dizzy.

## 2021-04-20 NOTE — ED Provider Notes (Signed)
[facility]    CSN: [id number] Arrival date & time: 04/20/21  1644      History   Chief Complaint Chief Complaint  Patient presents with   Cough   Nasal Congestion   Fall    HPI Julie Lynn is a 80 y.o. female.   80 year old female accompanied by her daughter with concern over nasal congestion, dizziness and cough that started yesterday. She started with a headache and some abdominal pain but that has improved today. Felt dizzy and fell yesterday and then fell again this morning. Did not hit her head and no distinct injury. Has felt warm but no documented fever. Decreased appetite but no vomiting or diarrhea. Has to change positions slowly due to dizziness. Daughter gave her OTC Tylenol cold and sinus medication today with some relief. Was exposed to someone who was positive for COVID 19 10 days ago. Has received her seasonal flu vaccine. Other chronic health issues include Parkinson's disorder and uncertain if increase in dizziness and more slurred speech earlier today is part of Parkinson's or another new condition. Recent history in Sept 2022 of occasional dizziness and was prescribed Meclizine but does not recall if she actually took medication. Also has history of HTN, GERD, hyperlipidemia, anxiety and osteoporosis. Currently on Norvasc, Zetia, Sinemet, Prilosec daily and Fosamax once weekly.   The history is provided by the patient and a caregiver.   Past Medical History:  Diagnosis Date   Anxiety    Cancer ([place]) 05/2017   skin   Depression    GERD (gastroesophageal reflux disease)    Heartburn    Hepatitis A 1962   HLD (hyperlipidemia)    HTN (hypertension)    Parkinson disease ([place])    Scoliosis    Vertigo    rare    There are no problems to display for this patient.   Past Surgical History:  Procedure Laterality Date   ABDOMINAL HYSTERECTOMY  1977   CATARACT EXTRACTION W/PHACO Left 11/01/2017   Procedure: CATARACT EXTRACTION PHACO AND INTRAOCULAR  LENS PLACEMENT ([place]) LEFT;  Surgeon: Leandrew Koyanagi, MD;  Location: [place];  Service: Ophthalmology;  Laterality: Left;   CATARACT EXTRACTION W/PHACO Right 12/12/2018   Procedure: CATARACT EXTRACTION PHACO AND INTRAOCULAR LENS PLACEMENT ([place])  RIGHT;  Surgeon: Leandrew Koyanagi, MD;  Location: [place];  Service: Ophthalmology;  Laterality: Right;   COLONOSCOPY WITH PROPOFOL N/A 06/15/2018   Procedure: COLONOSCOPY WITH PROPOFOL;  Surgeon: Lollie Sails, MD;  Location: [facility] ENDOSCOPY;  Service: Endoscopy;  Laterality: N/A;   ESOPHAGOGASTRODUODENOSCOPY (EGD) WITH PROPOFOL N/A 06/15/2018   Procedure: ESOPHAGOGASTRODUODENOSCOPY (EGD) WITH PROPOFOL;  Surgeon: Lollie Sails, MD;  Location: [facility] ENDOSCOPY;  Service: Endoscopy;  Laterality: N/A;    OB History   No obstetric history on file.      Home Medications    Prior to Admission medications   Medication Sig Start Date End Date Taking? Authorizing Provider  meclizine (ANTIVERT) 12.5 MG tablet Take 1 tablet (12.5 mg total) by mouth every 8 (eight) hours as needed for dizziness. 04/20/21  Yes Reginaldo Hazard, Nicholes Stairs, NP  alendronate (FOSAMAX) 70 MG tablet Take 70 mg by mouth once a week. Take with a full glass of water on an empty stomach.    [provider]  amLODipine (NORVASC) 5 MG tablet Take 2.5 mg by mouth as needed.    [provider]  busPIRone (BUSPAR) 15 MG tablet Take 15 mg by mouth 2 (two) times daily.  [provider]  carbidopa-levodopa (SINEMET IR) 25-100 MG tablet Take 1 tablet by mouth at bedtime.    [provider]  carbidopa-levodopa (SINEMET IR) 25-250 MG tablet Take 2 tablets by mouth 4 (four) times daily. Pt a uses 25/200    [provider]  clobetasol ointment (TEMOVATE) 3.29 % Apply 1 application topically at bedtime as needed.    [provider]  ezetimibe (ZETIA) 10 MG tablet Take by mouth. 07/19/16   [provider]   omeprazole (PRILOSEC) 20 MG capsule TAKE 1 CAPSULE DAILY 05/24/16   [provider]  Vitamin D, Ergocalciferol, (DRISDOL) 50000 units CAPS capsule TAKE 1 CAPSULE ONCE A WEEK 03/08/16   [provider]    Family History Family History  Problem Relation Age of Onset   Breast cancer Mother 56   Kidney cancer Neg Hx    Bladder Cancer Neg Hx     Social History Social History   Tobacco Use   Smoking status: Never   Smokeless tobacco: Never  Vaping Use   Vaping Use: Never used  Substance Use Topics   Alcohol use: No   Drug use: No     Allergies   Simvastatin, Ultram [tramadol hcl], and Adhesive [tape]   Review of Systems Review of Systems  Constitutional:  Positive for appetite change, fatigue and fever (uncertain). Negative for chills.  HENT:  Positive for congestion, postnasal drip, sinus pressure and sore throat (irritated). Negative for ear discharge, ear pain, facial swelling, nosebleeds and trouble swallowing.   Eyes:  Negative for pain, discharge, redness and itching.  Respiratory:  Positive for cough. Negative for shortness of breath and wheezing.   Gastrointestinal:  Positive for abdominal pain and nausea. Negative for diarrhea and vomiting.  Musculoskeletal:  Positive for arthralgias. Negative for neck stiffness.  Skin:  Negative for color change and rash.  Allergic/Immunologic: Negative for environmental allergies and food allergies.  Neurological:  Positive for dizziness, speech difficulty, weakness, light-headedness and headaches. Negative for seizures, syncope and facial asymmetry.  Hematological:  Negative for adenopathy. Does not bruise/bleed easily.    Physical Exam Triage Vital Signs ED Triage Vitals  Enc Vitals Group     BP 04/20/21 1710 (!) 115/42     Pulse Rate 04/20/21 1710 72     Resp 04/20/21 1710 16     Temp 04/20/21 1710 98.3 F (36.8 C)     Temp Source 04/20/21 1710 Oral     SpO2 04/20/21 1710 99 %     Weight --       Height --      Head Circumference --      Peak Flow --      Pain Score 04/20/21 1709 0     Pain Loc --      Pain Edu? --      Excl. in [place]? --    No data found.  Updated Vital Signs BP (!) 115/42 (BP Location: Right Arm)    Pulse 72    Temp 98.3 F (36.8 C) (Oral)    Resp 16    LMP  (LMP Unknown)    SpO2 99%   Visual Acuity Right Eye Distance:   Left Eye Distance:   Bilateral Distance:    Right Eye Near:   Left Eye Near:    Bilateral Near:     Physical Exam Vitals and nursing note reviewed.  Constitutional:      General: She is awake. She is not in acute distress.  Appearance: She is well-developed and well-groomed.     Comments: She is sitting in the exam chair in no acute distress- uses a walker for support and is able to get onto the exam table with assistance.   HENT:     Head: Normocephalic and atraumatic.     Right Ear: Hearing, ear canal and external ear normal. A middle ear effusion is present. Tympanic membrane is bulging. Tympanic membrane is not injected or erythematous.     Left Ear: Hearing, ear canal and external ear normal. A middle ear effusion is present. Tympanic membrane is bulging. Tympanic membrane is not injected or erythematous.     Nose: Mucosal edema and congestion present.     Right Sinus: No maxillary sinus tenderness or frontal sinus tenderness.     Left Sinus: No maxillary sinus tenderness or frontal sinus tenderness.     Mouth/Throat:     Lips: Pink.     Mouth: Mucous membranes are dry.     Pharynx: Oropharynx is clear. Uvula midline. No pharyngeal swelling, oropharyngeal exudate, posterior oropharyngeal erythema or uvula swelling.  Eyes:     Extraocular Movements: Extraocular movements intact.     Conjunctiva/sclera: Conjunctivae normal.  Cardiovascular:     Rate and Rhythm: Normal rate and regular rhythm.     Heart sounds: Normal heart sounds. No murmur heard. Pulmonary:     Effort: Pulmonary effort is normal. No respiratory distress.      Breath sounds: Normal breath sounds and air entry. No decreased air movement. No decreased breath sounds, wheezing, rhonchi or rales.  Musculoskeletal:     Cervical back: Normal range of motion and neck supple.  Lymphadenopathy:     Cervical: No cervical adenopathy.  Skin:    General: Skin is warm and dry.     Capillary Refill: Capillary refill takes less than 2 seconds.  Neurological:     Mental Status: She is alert and oriented to person, place, and time.     Cranial Nerves: No facial asymmetry.     Sensory: Sensation is intact.  Psychiatric:        Attention and Perception: Attention normal.        Mood and Affect: Mood normal.        Behavior: Behavior is cooperative.        Thought Content: Thought content normal.        Cognition and Memory: Cognition is impaired. Memory is impaired.     Comments: Speech slightly delayed but talks in complete sentences.      UC Treatments / Results  Labs (all labs ordered are listed, but only abnormal results are displayed) Labs Reviewed  SARS CORONAVIRUS 2 (TAT 6-24 HRS)    EKG   Radiology No results found.  Procedures Procedures (including critical care time)  Medications Ordered in UC Medications - No data to display  Initial Impression / Assessment and Plan / UC Course  I have reviewed the triage vital signs and the nursing notes.  Pertinent labs & imaging results that were available during my care of the patient were reviewed by me and considered in my medical decision making (see chart for details).     Discussed that she does have fluid in her inner ear which can contribute to dizziness. Recommend restart Flonase 1 spray each nostril twice a day for the next 1-2 weeks then can decrease to 1 spray each nostril daily. May trial Meclizine 12.5mg  every 8 hours as needed for dizziness- reviewed side effects  with patient and daughter. Continue to change positions slowly to minimize dizziness and help avoid falling. Recommend  continue Tylenol 1000mg  every 8 hours as needed for pain/headache. Continue to push fluids to help loosen up any mucus in sinuses and chest. Sent specimen for COVID 19 testing. Doubt Influenza and unable to perform rapid influenza test due to lack of availability of testing supplies. Discussed that she may need to see her Neurologist soon to determine if recent changes in speech and movements are due to worsening of Parkinson's or another etiology. Reviewed with daughter and patient that if dizziness gets worse or if she falls again in the next 24 hours or if speech becomes more slurred again, recommend go to the ER ASAP. Otherwise, follow-up pending COVID 19 test results.  Final Clinical Impressions(s) / UC Diagnoses   Final diagnoses:  Nasal congestion  Eustachian tube dysfunction, bilateral  Dizziness  Acute cough  Fall, initial encounter     Discharge Instructions      Recommend restart Flonase 1 spray each nostril twice a day while you are having symptoms and then can decrease to 1 spray each nostril once daily. May trial Meclizine 12.5mg  every 8 hours as needed for dizziness. Continue to change positions slowly to minimize dizziness and to help avoid falling. May continue Tylenol 1000mg  every 8 hours as needed for pain/headache. Continue to push fluids to help loosen up any mucus in sinuses and chest. If dizziness gets worse or you fall again in the next 24 hours or if your speech becomes more slurred again, recommend go to the ER ASAP. Otherwise, follow-up pending COVID 19 test results.     ED Prescriptions     Medication Sig Dispense Auth. Provider   meclizine (ANTIVERT) 12.5 MG tablet Take 1 tablet (12.5 mg total) by mouth every 8 (eight) hours as needed for dizziness. 30 tablet Kaiser Belluomini, Nicholes Stairs, NP      PDMP not reviewed this encounter.   Katy Apo, NP 04/21/21 [phone]

## 2021-04-20 NOTE — Discharge Instructions (Addendum)
Recommend restart Flonase 1 spray each nostril twice a day while you are having symptoms and then can decrease to 1 spray each nostril once daily. May trial Meclizine 12.5mg  every 8 hours as needed for dizziness. Continue to change positions slowly to minimize dizziness and to help avoid falling. May continue Tylenol 1000mg  every 8 hours as needed for pain/headache. Continue to push fluids to help loosen up any mucus in sinuses and chest. If dizziness gets worse or you fall again in the next 24 hours or if your speech becomes more slurred again, recommend go to the ER ASAP. Otherwise, follow-up pending COVID 19 test results.

## 2021-04-21 LAB — SARS CORONAVIRUS 2 (TAT 6-24 HRS): SARS Coronavirus 2: NEGATIVE

## 2021-05-13 ENCOUNTER — Other Ambulatory Visit: Payer: Self-pay

## 2021-05-13 ENCOUNTER — Emergency Department: Payer: Medicare PPO

## 2021-05-13 ENCOUNTER — Emergency Department
Admission: EM | Admit: 2021-05-13 | Discharge: 2021-05-13 | Disposition: A | Discharge status: Home / Self Care | Payer: Medicare PPO | Attending: Emergency Medicine | Admitting: Emergency Medicine

## 2021-05-13 DIAGNOSIS — E860 Dehydration: Secondary | ICD-10-CM | POA: Insufficient documentation

## 2021-05-13 DIAGNOSIS — I959 Hypotension, unspecified: Secondary | ICD-10-CM | POA: Insufficient documentation

## 2021-05-13 DIAGNOSIS — R031 Nonspecific low blood-pressure reading: Secondary | ICD-10-CM | POA: Diagnosis present

## 2021-05-13 DIAGNOSIS — I10 Essential (primary) hypertension: Secondary | ICD-10-CM | POA: Diagnosis not present

## 2021-05-13 DIAGNOSIS — Z20822 Contact with and (suspected) exposure to covid-19: Secondary | ICD-10-CM | POA: Diagnosis not present

## 2021-05-13 DIAGNOSIS — Z79899 Other long term (current) drug therapy: Secondary | ICD-10-CM | POA: Diagnosis not present

## 2021-05-13 HISTORY — DX: Essential (primary) hypertension: I10

## 2021-05-13 LAB — COMPREHENSIVE METABOLIC PANEL
ALT: <5 U/L (ref 0–44)
AST: 17 U/L (ref 15–41)
Albumin: 2.9 g/dL — ABNORMAL LOW (ref 3.5–5.0)
Alkaline Phosphatase: 45 U/L (ref 38–126)
Anion gap: 7 (ref 5–15)
BUN: 13 mg/dL (ref 8–23)
CO2: 26 mmol/L (ref 22–32)
Calcium: 8.1 mg/dL — ABNORMAL LOW (ref 8.9–10.3)
Chloride: 107 mmol/L (ref 98–111)
Creatinine, Ser: 0.71 mg/dL (ref 0.44–1.00)
GFR, Estimated: >60 mL/min (ref >60)
Glucose, Bld: 136 mg/dL — ABNORMAL HIGH (ref 70–99)
Potassium: 3.6 mmol/L (ref 3.5–5.1)
Sodium: 140 mmol/L (ref 135–145)
Total Bilirubin: 0.9 mg/dL (ref 0.3–1.2)
Total Protein: 5.2 g/dL — ABNORMAL LOW (ref 6.5–8.1)

## 2021-05-13 LAB — URINALYSIS, COMPLETE (UACMP) WITH MICROSCOPIC
Bacteria, UA: NONE SEEN
Bilirubin Urine: NEGATIVE
Glucose, UA: NEGATIVE mg/dL
Hgb urine dipstick: NEGATIVE
Ketones, ur: NEGATIVE mg/dL
Leukocytes,Ua: NEGATIVE
Nitrite: NEGATIVE
Protein, ur: NEGATIVE mg/dL
Specific Gravity, Urine: 1.006 (ref 1.005–1.030)
Squamous Epithelial / HPF: NONE SEEN (ref 0–5)
pH: 7 (ref 5.0–8.0)

## 2021-05-13 LAB — CBC WITH DIFFERENTIAL/PLATELET
Abs Immature Granulocytes: 0.01 10*3/uL (ref 0.00–0.07)
Basophils Absolute: 0 10*3/uL (ref 0.0–0.1)
Basophils Relative: 1 %
Eosinophils Absolute: 0.1 10*3/uL (ref 0.0–0.5)
Eosinophils Relative: 2 %
HCT: 35.8 % — ABNORMAL LOW (ref 36.0–46.0)
Hemoglobin: 11.8 g/dL — ABNORMAL LOW (ref 12.0–15.0)
Immature Granulocytes: 0 %
Lymphocytes Relative: 34 %
Lymphs Abs: 1.5 10*3/uL (ref 0.7–4.0)
MCH: 30.6 pg (ref 26.0–34.0)
MCHC: 33 g/dL (ref 30.0–36.0)
MCV: 93 fL (ref 80.0–100.0)
Monocytes Absolute: 0.4 10*3/uL (ref 0.1–1.0)
Monocytes Relative: 8 %
Neutro Abs: 2.5 10*3/uL (ref 1.7–7.7)
Neutrophils Relative %: 55 %
Platelets: 163 10*3/uL (ref 150–400)
RBC: 3.85 MIL/uL — ABNORMAL LOW (ref 3.87–5.11)
RDW: 12.7 % (ref 11.5–15.5)
WBC: 4.4 10*3/uL (ref 4.0–10.5)
nRBC: 0 % (ref 0.0–0.2)

## 2021-05-13 LAB — RESP PANEL BY RT-PCR (FLU A&B, COVID) ARPGX2
Influenza A by PCR: NEGATIVE
Influenza B by PCR: NEGATIVE
SARS Coronavirus 2 by RT PCR: NEGATIVE

## 2021-05-13 LAB — TROPONIN I (HIGH SENSITIVITY): Troponin I (High Sensitivity): 3 ng/L (ref <18)

## 2021-05-13 NOTE — ED Provider Notes (Signed)
[facility] Provider Note    Event Date/Time   First MD Initiated Contact with Patient 05/13/21 1108     (approximate)   History   Drug Overdose (amlodipine)   HPI  Julie Lynn is a 80 y.o. female with a past history of Parkinson's disease on Sinemet and hypertension on amlodipine taken as needed who comes the ED complaining of low blood pressure.  Patient reports she has been in her usual state of health, eating and drinking normally and without acute symptoms.  Yesterday she noticed that her blood pressure was elevated at about 180/90.  She called her doctor's office who told her to take a dose of her 5 mg amlodipine.  She took that yesterday, and this morning blood pressure still seemed elevated to the patient so she took another 5 mg tablet.  Subsequently, she noticed that her blood pressure was low.  Denies chest pain or shortness of breath or other acute symptoms.  She does have generalized weakness.  EMS report that initially her blood pressure was 70/50, after 1 L fluid bolus given during transport blood pressure improved to 94/51.  She has additional 500 mL IV fluids currently running from EMS.  Patient notes that her primary care doctor has made an appointment for her to be seen in the clinic tomorrow.  Patient denies any acute symptoms currently, states she feels fine.  Denies dysuria frequency urgency, no chest pain shortness of breath cough or fever.  No vomiting.  Past Medical History:  Diagnosis Date   Hypertension         Physical Exam   Triage Vital Signs: ED Triage Vitals  Enc Vitals Group     BP 05/13/21 1107 110/66     Pulse Rate 05/13/21 1107 66     Resp 05/13/21 1107 18     Temp --      Temp Source 05/13/21 1107 Oral     SpO2 05/13/21 1107 100 %     Weight 05/13/21 1108 117 lb (53.1 kg)     Height 05/13/21 1108 5\' 2"  (1.575 m)     Head Circumference --      Peak Flow --      Pain Score 05/13/21 1108 0     Pain Loc --      Pain  Edu? --      Excl. in [place]? --     Most recent vital signs: Vitals:   05/13/21 1107 05/13/21 1210  BP: 110/66 (!) 133/52  Pulse: 66 69  Resp: 18 16  SpO2: 100% 100%     General: Awake, no distress.  CV:  Good peripheral perfusion.  Resp:  Normal effort.  Abd:  No distention.  Other:  No soft tissue inflammatory changes.   ED Results / Procedures / Treatments   Labs (all labs ordered are listed, but only abnormal results are displayed) Labs Reviewed  COMPREHENSIVE METABOLIC PANEL - Abnormal; Notable for the following components:      Result Value   Glucose, Bld 136 (*)    Calcium 8.1 (*)    Total Protein 5.2 (*)    Albumin 2.9 (*)    All other components within normal limits  CBC WITH DIFFERENTIAL/PLATELET - Abnormal; Notable for the following components:   RBC 3.85 (*)    Hemoglobin 11.8 (*)    HCT 35.8 (*)    All other components within normal limits  URINALYSIS, COMPLETE (UACMP) WITH MICROSCOPIC - Abnormal; Notable for the following  components:   Color, Urine STRAW (*)    APPearance CLEAR (*)    All other components within normal limits  RESP PANEL BY RT-PCR (FLU A&B, COVID) [id number]  URINE CULTURE  TROPONIN I (HIGH SENSITIVITY)  TROPONIN I (HIGH SENSITIVITY)     EKG     RADIOLOGY Chest reviewed and interpreted by me, appears unremarkable.  Radiology report reviewed.    PROCEDURES:  Critical Care performed: No  Procedures   MEDICATIONS ORDERED IN ED: Medications - No data to display   IMPRESSION / MDM / Xenia / ED COURSE  I reviewed the triage vital signs and the nursing notes.                              Differential diagnosis includes, but is not limited to, dehydration, electrolyte abnormality, anemia, amlodipine toxicity, non-STEMI, pneumonia, UTI      Clinical Course as of 05/13/21 1359  Thu Julie 05, 2023  1110 Pt p/w low blood pressure of 70/40 after taking 5mg  norvasc yesterday and 5mg  again today. Unclear if this  is medication reaction or if she has other underlying illness such as dehydration, cystitis, nstemi. EKG unremarkable. Will check labs, cxr. [PS]  6073 Blood pressure remains stable after initial 1500 mL fluid bolus given by EMS.  Orthostatics are negative.  Work-up is normal, COVID-negative, flu negative.  UA negative for UTI.  Chest x-ray negative for signs of pneumonia.  Initial troponin is normal.  No leukocytosis on CBC.  Normal creatinine and electrolytes on chemistry panel.  Patient denies any chest pain or shortness of breath, no cardiac symptoms.  Repeat troponin or other further cardiac work-up is not indicated.  She is stable for discharge. [PS]    Clinical Course User Index [PS] Carrie Mew, MD     FINAL CLINICAL IMPRESSION(S) / ED DIAGNOSES   Final diagnoses:  Dehydration  Hypotension, unspecified hypotension type     Rx / DC Orders   ED Discharge Orders     None        Note:  This document was prepared using Dragon voice recognition software and may include unintentional dictation errors.   Carrie Mew, MD 05/13/21 1359

## 2021-05-13 NOTE — ED Notes (Signed)
Pt walked to toilet and back with steady gait and no dizzziness.

## 2021-05-13 NOTE — ED Triage Notes (Signed)
Pt to ED from home [facility] Pt accidentaly took too much amlodopine 10mg  amlodipine taken, rx was for as needed  CBG 221, no hx DM, hx PD Received about 1260mL fluid EMS 18g R AC BP was 70/50, after fluids was 94/51 EDP at bedside, pt is alert and oriented

## 2021-05-13 NOTE — Discharge Instructions (Addendum)
Your test today, including chest x-ray, blood test, and COVID/flu test, were all normal. Stop taking amlodipine and follow up with your doctor tomorrow for continued evaluation of your symptoms.

## 2021-05-13 NOTE — ED Notes (Signed)
Patient discharged to home per MD order. Patient in stable condition, and deemed medically cleared by ED provider for discharge. Discharge instructions reviewed with patient/family using "Teach Back"; verbalized understanding of medication education and administration, and information about follow-up care. Denies further concerns. ° °

## 2021-05-14 ENCOUNTER — Encounter: Payer: Self-pay | Admitting: Emergency Medicine

## 2021-05-15 LAB — URINE CULTURE: Culture: 20000 — AB

## 2021-05-24 ENCOUNTER — Other Ambulatory Visit: Payer: Self-pay | Admitting: *Deleted

## 2021-05-24 DIAGNOSIS — R3915 Urgency of urination: Secondary | ICD-10-CM

## 2021-05-25 ENCOUNTER — Other Ambulatory Visit
Admission: RE | Admit: 2021-05-25 | Discharge: 2021-05-25 | Disposition: A | Discharge status: Home / Self Care | Payer: Medicare PPO | Attending: Urology | Admitting: Urology

## 2021-05-25 ENCOUNTER — Other Ambulatory Visit: Payer: Self-pay

## 2021-05-25 ENCOUNTER — Ambulatory Visit (INDEPENDENT_AMBULATORY_CARE_PROVIDER_SITE_OTHER): Payer: Medicare PPO | Admitting: Urology

## 2021-05-25 ENCOUNTER — Encounter: Payer: Self-pay | Admitting: Urology

## 2021-05-25 VITALS — BP 173/82 | HR 84 | Ht 62.0 in | Wt 115.0 lb

## 2021-05-25 DIAGNOSIS — R3915 Urgency of urination: Secondary | ICD-10-CM | POA: Insufficient documentation

## 2021-05-25 LAB — URINALYSIS, COMPLETE (UACMP) WITH MICROSCOPIC
Bilirubin Urine: NEGATIVE
Glucose, UA: NEGATIVE mg/dL
Hgb urine dipstick: NEGATIVE
Ketones, ur: NEGATIVE mg/dL
Leukocytes,Ua: NEGATIVE
Nitrite: NEGATIVE
Protein, ur: NEGATIVE mg/dL
Specific Gravity, Urine: <1.005 — ABNORMAL LOW (ref 1.005–1.030)
pH: 6 (ref 5.0–8.0)

## 2021-05-25 LAB — BLADDER SCAN AMB NON-IMAGING

## 2021-05-25 MED ORDER — MIRABEGRON ER 50 MG PO TB24: 50.0000 mg | ORAL_TABLET | Freq: Every day | ORAL | 0 refills | Status: DC

## 2021-05-25 MED ORDER — NITROFURANTOIN MONOHYD MACRO 100 MG PO CAPS: 100.0000 mg | ORAL_CAPSULE | Freq: Two times a day (BID) | ORAL | 0 refills | Status: DC

## 2021-05-25 NOTE — Progress Notes (Signed)
05/25/21 3:46 PM   Julie Lynn Oct 25, 1940 [id number]  CC: Urinary urgency, UTIs  HPI: 81 year old female with Parkinson's disease here with her daughter today who helps provide much of the history.  She reports a few months of urinary urgency, that seems to have worsened over the last few weeks.  She denies any gross hematuria or dysuria.  She drinks primarily water during the day.  Notably she had a urine culture on 05/13/2021 that showed 20k E. coli that was resistant to numerous antibiotics.   PMH: Past Medical History:  Diagnosis Date   Anxiety    Cancer ([place]) 05/2017   skin   Depression    GERD (gastroesophageal reflux disease)    Heartburn    Hepatitis A 1962   HLD (hyperlipidemia)    HTN (hypertension)    Hypertension    Parkinson disease (HCC)    Scoliosis    Vertigo    rare    Surgical History: Past Surgical History:  Procedure Laterality Date   ABDOMINAL HYSTERECTOMY  1977   ABDOMINAL HYSTERECTOMY     CATARACT EXTRACTION W/PHACO Left 11/01/2017   Procedure: CATARACT EXTRACTION PHACO AND INTRAOCULAR LENS PLACEMENT ([place]) LEFT;  Surgeon: Leandrew Koyanagi, MD;  Location: [facility];  Service: Ophthalmology;  Laterality: Left;   CATARACT EXTRACTION W/PHACO Right 12/12/2018   Procedure: CATARACT EXTRACTION PHACO AND INTRAOCULAR LENS PLACEMENT ([place])  RIGHT;  Surgeon: Leandrew Koyanagi, MD;  Location: [place];  Service: Ophthalmology;  Laterality: Right;   COLONOSCOPY WITH PROPOFOL N/A 06/15/2018   Procedure: COLONOSCOPY WITH PROPOFOL;  Surgeon: Lollie Sails, MD;  Location: [facility] ENDOSCOPY;  Service: Endoscopy;  Laterality: N/A;   ESOPHAGOGASTRODUODENOSCOPY (EGD) WITH PROPOFOL N/A 06/15/2018   Procedure: ESOPHAGOGASTRODUODENOSCOPY (EGD) WITH PROPOFOL;  Surgeon: Lollie Sails, MD;  Location: [facility] ENDOSCOPY;  Service: Endoscopy;  Laterality: N/A;     Family History: Family History  Problem Relation Age of Onset   Breast cancer Mother  76   Kidney cancer Neg Hx    Bladder Cancer Neg Hx     Social History:  reports that she has never smoked. She has never used smokeless tobacco. She reports that she does not currently use alcohol. She reports that she does not use drugs.  Physical Exam: BP (!) 173/82    Pulse 84    Ht 5\' 2"  (1.575 m)    Wt 115 lb (52.2 kg)    LMP  (LMP Unknown)    BMI 21.03 kg/m    Constitutional: Flat affect, no acute distress. Cardiovascular: No clubbing, cyanosis, or edema. Respiratory: Normal respiratory effort, no increased work of breathing. GI: Abdomen is soft, nontender, nondistended, no abdominal masses   Laboratory Data: Reviewed, see HPI  Pertinent Imaging: None to review  Assessment & Plan:   81 year old female with Parkinson's and primary complaint of urinary frequency and urgency.  We discussed possible etiologies including UTI with recent urine culture positive for 20 K E. coli versus overactive bladder related to her Parkinson's disease.  We discussed that overactive bladder (OAB) is not a disease, but is a symptom complex that is generally not life-threatening.  Symptoms typically include urinary urgency, frequency, and urge incontinence.  There are numerous treatment options, however there are risks and benefits with both medical and surgical management.  First-line treatment is behavioral therapies including bladder training, pelvic floor muscle training, and fluid management.  Second line treatments include oral antimuscarinics(Ditropan er, Trospium) and beta-3 agonist (Mybetriq). There is typically a period of  medication trial (4-8 weeks) to find the optimal therapy and dosing. If symptoms are bothersome despite the above management, third line options include intra-detrusor botox, peripheral tibial nerve stimulation (PTNS), and interstim (SNS). These are more invasive treatments with higher side effect profile, but may improve quality of life for patients with severe OAB symptoms.    -I recommended a trial of nitrofurantoin 100 mg twice daily x7 days for recent positive urine culture -If no improvement after antibiotics, recommend starting Myrbetriq, and samples were given.  Would avoid anticholinergics with her Parkinson's disease and age -RTC 6 weeks symptom check  Nickolas Madrid, MD 05/25/2021  [facility] [address], [place], [place] [address] [phone]

## 2021-05-25 NOTE — Patient Instructions (Signed)
Your urinary symptoms may be from a UTI, or from overactive bladder.  If your urinary symptoms do not improve after the week of antibiotics, okay to start the Myrbetriq to see if this helps the urgency and frequency  Overactive Bladder, Adult Overactive bladder is a condition in which a person has a sudden and frequent need to urinate. A person might also leak urine if he or she cannot get to the bathroom fast enough (urinary incontinence). Sometimes, symptoms can interfere with work or social activities. What are the causes? Overactive bladder is associated with poor nerve signals between your bladder and your brain. Your bladder may get the signal to empty before it is full. You may also have very sensitive muscles that make your bladder squeeze too soon. This condition may also be caused by other factors, such as: Medical conditions: Urinary tract infection. Infection of nearby tissues. Prostate enlargement. Bladder stones, inflammation, or tumors. Diabetes. Muscle or nerve weakness, especially from these conditions: A spinal cord injury. Stroke. Multiple sclerosis. Parkinson's disease. Other causes: Surgery on the uterus or urethra. Drinking too much caffeine or alcohol. Certain medicines, especially those that eliminate extra fluid in the body (diuretics). Constipation. What increases the risk? You may be at greater risk for overactive bladder if you: Are an older adult. Smoke. Are going through menopause. Have prostate problems. Have a neurological disease, such as stroke, dementia, Parkinson's disease, or multiple sclerosis (MS). Eat or drink alcohol, spicy food, caffeine, and other things that irritate the bladder. Are overweight or obese. What are the signs or symptoms? Symptoms of this condition include a sudden, strong urge to urinate. Other symptoms include: Leaking urine. Urinating 8 or more times a day. Waking up to urinate 2 or more times overnight. How is this  diagnosed? This condition may be diagnosed based on: Your symptoms and medical history. A physical exam. Blood or urine tests to check for possible causes, such as infection. You may also need to see a health care provider who specializes in urinary tract problems. This is called a urologist. How is this treated? Treatment for overactive bladder depends on the cause of your condition and whether it is mild or severe. Treatment may include: Bladder training, such as: Learning to control the urge to urinate by following a schedule to urinate at regular intervals. Doing Kegel exercises to strengthen the pelvic floor muscles that support your bladder. Special devices, such as: Biofeedback. This uses sensors to help you become aware of your body's signals. Electrical stimulation. This uses electrodes placed inside the body (implanted) or outside the body. These electrodes send gentle pulses of electricity to strengthen the nerves or muscles that control the bladder. Women may use a plastic device, called a pessary, that fits into the vagina and supports the bladder. Medicines, such as: Antibiotics to treat bladder infection. Antispasmodics to stop the bladder from releasing urine at the wrong time. Tricyclic antidepressants to relax bladder muscles. Injections of botulinum toxin type A directly into the bladder tissue to relax bladder muscles. Surgery, such as: A device may be implanted to help manage the nerve signals that control urination. An electrode may be implanted to stimulate electrical signals in the bladder. A procedure may be done to change the shape of the bladder. This is done only in very severe cases. Follow these instructions at home: Eating and drinking  Make diet or lifestyle changes recommended by your health care provider. These may include: Drinking fluids throughout the day and not only with  meals. Cutting down on caffeine or alcohol. Eating a healthy and balanced diet  to prevent constipation. This may include: Choosing foods that are high in fiber, such as beans, whole grains, and fresh fruits and vegetables. Limiting foods that are high in fat and processed sugars, such as fried and sweet foods. Lifestyle  Lose weight if needed. Do not use any products that contain nicotine or tobacco. These include cigarettes, chewing tobacco, and vaping devices, such as e-cigarettes. If you need help quitting, ask your health care provider. General instructions Take over-the-counter and prescription medicines only as told by your health care provider. If you were prescribed an antibiotic medicine, take it as told by your health care provider. Do not stop taking the antibiotic even if you start to feel better. Use any implants or pessary as told by your health care provider. If needed, wear pads to absorb urine leakage. Keep a log to track how much and when you drink, and when you need to urinate. This will help your health care provider monitor your condition. Keep all follow-up visits. This is important. Contact a health care provider if: You have a fever or chills. Your symptoms do not get better with treatment. Your pain and discomfort get worse. You have more frequent urges to urinate. Get help right away if: You are not able to control your bladder. Summary Overactive bladder refers to a condition in which a person has a sudden and frequent need to urinate. Several conditions may lead to an overactive bladder. Treatment for overactive bladder depends on the cause and severity of your condition. Making lifestyle changes, doing Kegel exercises, keeping a log, and taking medicines can help with this condition. This information is not intended to replace advice given to you by your health care provider. Make sure you discuss any questions you have with your health care provider. Document Revised: 01/13/2020 Document Reviewed: 01/13/2020 Elsevier Patient Education   [place].

## 2021-05-26 ENCOUNTER — Telehealth: Payer: Self-pay | Admitting: Family Medicine

## 2021-05-26 DIAGNOSIS — N3000 Acute cystitis without hematuria: Secondary | ICD-10-CM

## 2021-05-26 MED ORDER — FOSFOMYCIN TROMETHAMINE 3 G PO PACK: 3.0000 g | PACK | Freq: Once | ORAL | 0 refills | Status: AC

## 2021-05-26 NOTE — Addendum Note (Signed)
Addended by: Donalee Citrin on: 05/26/2021 01:58 PM   Modules accepted: Orders

## 2021-05-26 NOTE — Telephone Encounter (Signed)
Patient's daughter Santiago Glad called stating the Macrobid is making patient nauseous, headache and diarrhea. Patient has stopped the medication and Santiago Glad is wanting to know if there is a different ABX she can try? Please advise

## 2021-05-26 NOTE — Telephone Encounter (Signed)
Called pt's daughter she states that patient took 1 dose of nitrofurantion last night, subsequently became nauseated with headache. Daughter states that she does believe patient took the medication with food, although her mother does have memory issues. Informed daughter of information per Dr. Diamantina Providence. Daughter voiced understanding. RX sent for Fosfomycin.

## 2021-05-26 NOTE — Telephone Encounter (Signed)
UTI was multidrug resistant- only other option would be fosfomycin 3 g x1 dose  Nickolas Madrid, MD 05/26/2021

## 2021-06-29 ENCOUNTER — Ambulatory Visit (INDEPENDENT_AMBULATORY_CARE_PROVIDER_SITE_OTHER): Payer: Medicare PPO | Admitting: Urology

## 2021-06-29 ENCOUNTER — Other Ambulatory Visit: Payer: Self-pay

## 2021-06-29 VITALS — BP 154/79 | HR 73 | Ht 62.0 in | Wt 116.6 lb

## 2021-06-29 DIAGNOSIS — N390 Urinary tract infection, site not specified: Secondary | ICD-10-CM | POA: Diagnosis not present

## 2021-06-29 DIAGNOSIS — N3281 Overactive bladder: Secondary | ICD-10-CM | POA: Diagnosis not present

## 2021-06-29 MED ORDER — MIRABEGRON ER 25 MG PO TB24: 25.0000 mg | ORAL_TABLET | Freq: Every day | ORAL | 3 refills | Status: DC

## 2021-06-29 NOTE — Patient Instructions (Signed)

## 2021-06-29 NOTE — Progress Notes (Signed)
° °  06/29/2021 11:25 AM   Julie Lynn 1940-05-21 [id number]  Reason for visit: Follow up UTI, overactive bladder  HPI: 81 year old female with Parkinson's disease who I saw in January 2023 for overactive symptoms of urgency and frequency, and recent positive urine culture consistent with ESBL E. coli UTI.  The history is obtained primarily from her daughter again today.  We originally tried nitrofurantoin, but she had significant adverse reaction to that with severe nausea, and she was changed to fosfomycin.  This resolved the majority of her urinary symptoms.  She has some mild persistent urgency, and frequency, especially when hearing running water, but it does not sound like she is having any significant urge incontinence.  At our last visit I had given the Myrbetriq samples to consider trying if she had persistent urgency/frequency after treatment of UTI.  She has not yet tried those medications.  I think it is reasonable to try the Myrbetriq samples to see if this resolves her urgency/frequency, and we reviewed the correlation between Parkinson's disease and OAB.  A prescription was sent for Myrbetriq 25 mg daily if she has improvement on the samples.  -Trial of Myrbetriq 25 mg daily for OAB-> they will contact the clinic if persistent symptoms despite Myrbetriq.  She is not a candidate for anticholinergics with her age and Parkinson's disease. -RTC 1 year  Billey Co, MD  [facility] [address] [place], [place] [address] [phone]

## 2021-07-06 ENCOUNTER — Telehealth: Payer: Self-pay

## 2021-07-06 NOTE — Telephone Encounter (Signed)
PA submitted via covermymeds for Myrbetriq 07/06/21

## 2021-07-07 NOTE — Telephone Encounter (Signed)
Appeal for [employer] and approved, effective 07/06/2021 - 07/05/2023. Patient informed.  ?

## 2021-07-07 NOTE — Telephone Encounter (Signed)
Incoming letter from [employer] stating that they have denied the prior authorization for Myrbetriq and will not cover it or any other name brand medication due to the patient's insurance plan. They list alternatives (oxybutynin, solidenacin, tolterodine, trospium) but patient is not not a candidate for those due to Parkinson Disease.  ?

## 2022-02-24 ENCOUNTER — Other Ambulatory Visit: Payer: Self-pay | Admitting: Internal Medicine

## 2022-02-24 DIAGNOSIS — Z1231 Encounter for screening mammogram for malignant neoplasm of breast: Secondary | ICD-10-CM

## 2022-03-15 ENCOUNTER — Ambulatory Visit
Admission: RE | Admit: 2022-03-15 | Discharge: 2022-03-15 | Disposition: A | Discharge status: Home / Self Care | Payer: Medicare PPO | Source: Ambulatory Visit | Attending: Internal Medicine | Admitting: Internal Medicine

## 2022-03-15 DIAGNOSIS — Z1231 Encounter for screening mammogram for malignant neoplasm of breast: Secondary | ICD-10-CM | POA: Diagnosis present

## 2022-05-09 ENCOUNTER — Ambulatory Visit (INDEPENDENT_AMBULATORY_CARE_PROVIDER_SITE_OTHER): Payer: Medicare PPO

## 2022-05-09 ENCOUNTER — Ambulatory Visit
Admission: EM | Admit: 2022-05-09 | Discharge: 2022-05-09 | Disposition: A | Discharge status: Home / Self Care | Payer: Medicare PPO | Attending: Emergency Medicine | Admitting: Emergency Medicine

## 2022-05-09 DIAGNOSIS — W19XXXA Unspecified fall, initial encounter: Secondary | ICD-10-CM

## 2022-05-09 DIAGNOSIS — M25522 Pain in left elbow: Secondary | ICD-10-CM | POA: Diagnosis not present

## 2022-05-09 DIAGNOSIS — M79622 Pain in left upper arm: Secondary | ICD-10-CM | POA: Diagnosis not present

## 2022-05-09 DIAGNOSIS — S51011A Laceration without foreign body of right elbow, initial encounter: Secondary | ICD-10-CM | POA: Diagnosis not present

## 2022-05-09 DIAGNOSIS — S40021A Contusion of right upper arm, initial encounter: Secondary | ICD-10-CM

## 2022-05-09 NOTE — ED Provider Notes (Signed)
[facility]    CSN: [id number] Arrival date & time: 05/09/22  1215      History   Chief Complaint No chief complaint on file.   HPI Julie Lynn is a 82 y.o. female.   Patient presents for evaluation of the left upper arm and elbow after fall that occurred 3 days prior.  At that time denies hitting head or loss of consciousness.  Has full range of motion.  Has abrasions to the elbow, has been cleaning and monitoring.  Denies numbness or tingling.  Accompanied by daughter.  Past Medical History:  Diagnosis Date   Anxiety    Cancer ([place]) 05/2017   skin   Depression    GERD (gastroesophageal reflux disease)    Heartburn    Hepatitis A 1962   HLD (hyperlipidemia)    HTN (hypertension)    Hypertension    Parkinson disease    Scoliosis    Vertigo    rare    There are no problems to display for this patient.   Past Surgical History:  Procedure Laterality Date   ABDOMINAL HYSTERECTOMY  1977   ABDOMINAL HYSTERECTOMY     CATARACT EXTRACTION W/PHACO Left 11/01/2017   Procedure: CATARACT EXTRACTION PHACO AND INTRAOCULAR LENS PLACEMENT ([place]) LEFT;  Surgeon: Leandrew Koyanagi, MD;  Location: [place];  Service: Ophthalmology;  Laterality: Left;   CATARACT EXTRACTION W/PHACO Right 12/12/2018   Procedure: CATARACT EXTRACTION PHACO AND INTRAOCULAR LENS PLACEMENT ([place])  RIGHT;  Surgeon: Leandrew Koyanagi, MD;  Location: [place];  Service: Ophthalmology;  Laterality: Right;   COLONOSCOPY WITH PROPOFOL N/A 06/15/2018   Procedure: COLONOSCOPY WITH PROPOFOL;  Surgeon: Lollie Sails, MD;  Location: [facility] ENDOSCOPY;  Service: Endoscopy;  Laterality: N/A;   ESOPHAGOGASTRODUODENOSCOPY (EGD) WITH PROPOFOL N/A 06/15/2018   Procedure: ESOPHAGOGASTRODUODENOSCOPY (EGD) WITH PROPOFOL;  Surgeon: Lollie Sails, MD;  Location[facility] ENDOSCOPY;  Service: Endoscopy;  Laterality: N/A;    OB History   No obstetric history on file.      Home Medications     Prior to Admission medications   Medication Sig Start Date End Date Taking? Authorizing Provider  amLODipine (NORVASC) 5 MG tablet Take 2.5 mg by mouth as needed.    [provider]  carbidopa-levodopa (SINEMET IR) 25-250 MG tablet Take 2 tablets by mouth 4 (four) times daily. Pt a uses 25/200    [provider]  ezetimibe (ZETIA) 10 MG tablet Take by mouth. 07/19/16   [provider]  meclizine (ANTIVERT) 12.5 MG tablet Take 1 tablet (12.5 mg total) by mouth every 8 (eight) hours as needed for dizziness. 04/20/21   Katy Apo, NP  mirabegron ER (MYRBETRIQ) 25 MG TB24 tablet Take 1 tablet (25 mg total) by mouth daily. 06/29/21   Billey Co, MD  mirtazapine (REMERON) 7.5 MG tablet Take 7.5 mg by mouth at bedtime. 05/17/21   [provider]  omeprazole (PRILOSEC) 20 MG capsule TAKE 1 CAPSULE DAILY 05/24/16   [provider]  Vitamin D, Ergocalciferol, (DRISDOL) 50000 units CAPS capsule TAKE 1 CAPSULE ONCE A WEEK 03/08/16   [provider]    Family History Family History  Problem Relation Age of Onset   Breast cancer Mother 45   Kidney cancer Neg Hx    Bladder Cancer Neg Hx     Social History Social History   Tobacco Use   Smoking status: Never   Smokeless tobacco: Never  Vaping Use   Vaping Use: Never  used  Substance Use Topics   Alcohol use: Not Currently   Drug use: Never     Allergies   Tramadol, Nitrofuran derivatives, Simvastatin, Ultram [tramadol hcl], Adhesive [tape], and Wound dressing adhesive   Review of Systems Review of Systems   Physical Exam Triage Vital Signs ED Triage Vitals  Enc Vitals Group     BP 05/09/22 1400 (!) 140/75     Pulse Rate 05/09/22 1400 71     Resp 05/09/22 1400 16     Temp 05/09/22 1400 97.8 F (36.6 C)     Temp Source 05/09/22 1400 Oral     SpO2 05/09/22 1400 100 %     Weight 05/09/22 1358 117 lb (53.1 kg)     Height 05/09/22 1358 '5\' 2"'$  (1.575 m)     Head  Circumference --      Peak Flow --      Pain Score 05/09/22 1357 5     Pain Loc --      Pain Edu? --      Excl. in [place]? --    No data found.  Updated Vital Signs BP (!) 140/75 (BP Location: Right Arm)   Pulse 71   Temp 97.8 F (36.6 C) (Oral)   Resp 16   Ht '5\' 2"'$  (1.575 m)   Wt 117 lb (53.1 kg)   LMP  (LMP Unknown)   SpO2 100%   BMI 21.40 kg/m   Visual Acuity Right Eye Distance:   Left Eye Distance:   Bilateral Distance:    Right Eye Near:   Left Eye Near:    Bilateral Near:     Physical Exam Constitutional:      Appearance: Normal appearance.  Eyes:     Extraocular Movements: Extraocular movements intact.  Pulmonary:     Effort: Pulmonary effort is normal.  Skin:    Comments: Skin tear present to the lateral aspect of the left elbow, skin intact, no erythema, drainage, tenderness present  2+ radial pulse, has full range of motion,   Contusion to anterior upper arm   Neurological:     Mental Status: She is alert and oriented to person, place, and time. Mental status is at baseline.  Psychiatric:        Mood and Affect: Mood normal.        Behavior: Behavior normal.      UC Treatments / Results  Labs (all labs ordered are listed, but only abnormal results are displayed) Labs Reviewed - No data to display  EKG   Radiology No results found.  Procedures Procedures (including critical care time)  Medications Ordered in UC Medications - No data to display  Initial Impression / Assessment and Plan / UC Course  I have reviewed the triage vital signs and the nursing notes.  Pertinent labs & imaging results that were available during my care of the patient were reviewed by me and considered in my medical decision making (see chart for details).  Skin tear of right elbow without complication, contusion of right upper arm, fall  X-ray to the humerus and elbow are negative, discussed with patient and  family, skin tear has closed and appears to be  healing appropriately, no signs of infection, contusion will improve with time, recommended supportive measures advised, Tylenol and activity as tolerated, may follow-up with urgent care or primary doctor for any further concerns Final Clinical Impressions(s) / UC Diagnoses   Final diagnoses:  Skin tear of right elbow without complication, initial  encounter  Contusion of right upper arm, initial encounter  Fall, initial encounter   Discharge Instructions   None    ED Prescriptions   None    PDMP not reviewed this encounter.   Hans Eden, NP 05/09/22 [phone]

## 2022-05-09 NOTE — ED Triage Notes (Addendum)
Patient states she fell on Friday night and states she have soreness, swelling and bruising on the back of left arm below her elbow.   Pt daughter reports that she has fallen 3 times in the last 2.5 weeks.

## 2022-05-09 NOTE — Discharge Instructions (Addendum)
X-rays are negative for injury to the bone, symptoms should improve with time  Bruising to the arm, may use ice over this area in 10 to 15-minute intervals for comfort  May give Tylenol 500 mg every 6 hours as needed for pain   Continue activity as tolerated  Skin tear appears to be in healing appropriately, no current signs of infection, do not need to cover anymore as skin has closed  Monitor area on the back, symptoms continue to persist please follow-up with primary doctor for reevaluation

## 2022-07-05 ENCOUNTER — Ambulatory Visit: Payer: Medicare PPO | Admitting: Urology

## 2022-07-30 ENCOUNTER — Emergency Department
Admission: EM | Admit: 2022-07-30 | Discharge: 2022-07-30 | Disposition: A | Discharge status: Home / Self Care | Payer: Medicare PPO | Attending: Emergency Medicine | Admitting: Emergency Medicine

## 2022-07-30 ENCOUNTER — Other Ambulatory Visit: Payer: Self-pay

## 2022-07-30 ENCOUNTER — Emergency Department: Payer: Medicare PPO

## 2022-07-30 DIAGNOSIS — S0101XA Laceration without foreign body of scalp, initial encounter: Secondary | ICD-10-CM | POA: Diagnosis not present

## 2022-07-30 DIAGNOSIS — W2209XA Striking against other stationary object, initial encounter: Secondary | ICD-10-CM | POA: Insufficient documentation

## 2022-07-30 DIAGNOSIS — W19XXXA Unspecified fall, initial encounter: Secondary | ICD-10-CM

## 2022-07-30 DIAGNOSIS — S0990XA Unspecified injury of head, initial encounter: Secondary | ICD-10-CM | POA: Diagnosis present

## 2022-07-30 LAB — COMPREHENSIVE METABOLIC PANEL
ALT: 6 U/L (ref 0–44)
AST: 23 U/L (ref 15–41)
Albumin: 3.2 g/dL — ABNORMAL LOW (ref 3.5–5.0)
Alkaline Phosphatase: 50 U/L (ref 38–126)
Anion gap: 8 (ref 5–15)
BUN: 26 mg/dL — ABNORMAL HIGH (ref 8–23)
CO2: 21 mmol/L — ABNORMAL LOW (ref 22–32)
Calcium: 8.1 mg/dL — ABNORMAL LOW (ref 8.9–10.3)
Chloride: 112 mmol/L — ABNORMAL HIGH (ref 98–111)
Creatinine, Ser: 0.94 mg/dL (ref 0.44–1.00)
GFR, Estimated: >60 mL/min (ref >60)
Glucose, Bld: 84 mg/dL (ref 70–99)
Potassium: 2.9 mmol/L — ABNORMAL LOW (ref 3.5–5.1)
Sodium: 141 mmol/L (ref 135–145)
Total Bilirubin: 0.8 mg/dL (ref 0.3–1.2)
Total Protein: 5.8 g/dL — ABNORMAL LOW (ref 6.5–8.1)

## 2022-07-30 LAB — CBC WITH DIFFERENTIAL/PLATELET
Abs Immature Granulocytes: 0.02 10*3/uL (ref 0.00–0.07)
Basophils Absolute: 0 10*3/uL (ref 0.0–0.1)
Basophils Relative: 0 %
Eosinophils Absolute: 0.1 10*3/uL (ref 0.0–0.5)
Eosinophils Relative: 1 %
HCT: 41.7 % (ref 36.0–46.0)
Hemoglobin: 13.6 g/dL (ref 12.0–15.0)
Immature Granulocytes: 0 %
Lymphocytes Relative: 28 %
Lymphs Abs: 1.5 10*3/uL (ref 0.7–4.0)
MCH: 30.7 pg (ref 26.0–34.0)
MCHC: 32.6 g/dL (ref 30.0–36.0)
MCV: 94.1 fL (ref 80.0–100.0)
Monocytes Absolute: 0.3 10*3/uL (ref 0.1–1.0)
Monocytes Relative: 5 %
Neutro Abs: 3.3 10*3/uL (ref 1.7–7.7)
Neutrophils Relative %: 66 %
Platelets: 180 10*3/uL (ref 150–400)
RBC: 4.43 MIL/uL (ref 3.87–5.11)
RDW: 12.5 % (ref 11.5–15.5)
WBC: 5.2 10*3/uL (ref 4.0–10.5)
nRBC: 0 % (ref 0.0–0.2)

## 2022-07-30 LAB — TROPONIN I (HIGH SENSITIVITY): Troponin I (High Sensitivity): 3 ng/L (ref <18)

## 2022-07-30 MED ORDER — LIDOCAINE-EPINEPHRINE 2 %-1:100000 IJ SOLN
20.0000 mL | Freq: Once | INTRAMUSCULAR | Status: AC
  Administered 2022-07-30: 20 mL via INTRADERMAL
  Filled 2022-07-30: qty 1

## 2022-07-30 NOTE — ED Triage Notes (Signed)
See original triage note- Pt bandaged by EMS with petroleum gauze and gauze wrap, bleeding is controlled. Pt reports hx parkinson's, does not take blood thinners, no cardiac hx.

## 2022-07-30 NOTE — ED Provider Notes (Signed)
[facility] Provider Note    Event Date/Time   First MD Initiated Contact with Patient 07/30/22 1358     (approximate)   History   Fall   HPI  Julie Lynn is a 82 y.o. female with a history of Parkinson's disease who presents after mechanical fall.  Patient hit her head on the refrigerator at home, suffered a laceration to the scalp.  Denies neck pain.  No chest pain abdominal pain.  Complains of mild pain in her right knee otherwise no back pain or other complaints.     Physical Exam   Triage Vital Signs: ED Triage Vitals  Enc Vitals Group     BP 07/30/22 1352 (!) 171/149     Pulse Rate 07/30/22 1352 72     Resp 07/30/22 1352 18     Temp 07/30/22 1352 98.3 F (36.8 C)     Temp Source 07/30/22 1352 Oral     SpO2 07/30/22 1352 100 %     Weight --      Height --      Head Circumference --      Peak Flow --      Pain Score 07/30/22 1350 9     Pain Loc --      Pain Edu? --      Excl. in [place]? --     Most recent vital signs: Vitals:   07/30/22 1352  BP: (!) 171/149  Pulse: 72  Resp: 18  Temp: 98.3 F (36.8 C)  SpO2: 100%     General: Awake, no distress.  CV:  Good peripheral perfusion.  No chest wall tenderness palpation Resp:  Normal effort.  Abd:  No distention.  Soft, nontender Other:  Scalp: Approximately 5 cm vertical laceration to the right upper forehead, no foreign bodies No vertebral tenderness palpation, normal range of motion of all extremities without pain.  No pain with axial load on both hips  ED Results / Procedures / Treatments   Labs (all labs ordered are listed, but only abnormal results are displayed) Labs Reviewed  COMPREHENSIVE METABOLIC PANEL - Abnormal; Notable for the following components:      Result Value   Potassium 2.9 (*)    Chloride 112 (*)    CO2 21 (*)    BUN 26 (*)    Calcium 8.1 (*)    Total Protein 5.8 (*)    Albumin 3.2 (*)    All other components within normal limits  CBC WITH  DIFFERENTIAL/PLATELET  TROPONIN I (HIGH SENSITIVITY)     EKG  ED ECG REPORT I, Lavonia Drafts, the attending physician, personally viewed and interpreted this ECG.  Date: 07/30/2022  Rhythm: normal sinus rhythm QRS Axis: normal Intervals: Right bundle branch block ST/T Wave abnormalities: normal Narrative Interpretation: no evidence of acute ischemia    RADIOLOGY CT head viewed interpreted me, no acute abnormality confirmed by radiology    PROCEDURES:  Critical Care performed:   Marland KitchenMarland KitchenLaceration Repair  Date/Time: 07/30/2022 3:26 PM  Performed by: Lavonia Drafts, MD Authorized by: Lavonia Drafts, MD   Consent:    Consent obtained:  Verbal   Consent given by:  Patient   Risks discussed:  Infection and pain Anesthesia:    Anesthesia method:  Local infiltration   Local anesthetic:  Lidocaine 1% WITH epi Laceration details:    Location:  Scalp   Scalp location:  Frontal   Length (cm):  5 Treatment:    Area cleansed with:  Povidone-iodine   Amount of cleaning:  Standard   Debridement:  None Skin repair:    Repair method:  Sutures   Suture size:  6-0   Suture material:  Nylon   Suture technique:  Simple interrupted   Number of sutures:  5 Approximation:    Approximation:  Close Repair type:    Repair type:  Simple Post-procedure details:    Dressing:  Bulky dressing   Procedure completion:  Tolerated well, no immediate complications    MEDICATIONS ORDERED IN ED: Medications  lidocaine-EPINEPHrine (XYLOCAINE W/EPI) 2 %-1:100000 (with pres) injection 20 mL (20 mLs Intradermal Given by Other 07/30/22 1444)     IMPRESSION / MDM / [place] / ED COURSE  I reviewed the triage vital signs and the nursing notes. Patient's presentation is most consistent with acute presentation with potential threat to life or bodily function.  Patient presents for a fall with head injury.  Differential includes minor head injury, laceration, ICH, concussion  CT head  ordered  Lab work reviewed and is quite reassuring  CT head is unremarkable, laceration repair performed by me  Suture removal in 5 days      FINAL CLINICAL IMPRESSION(S) / ED DIAGNOSES   Final diagnoses:  Fall, initial encounter  Injury of head, initial encounter  Laceration of scalp, initial encounter     Rx / DC Orders   ED Discharge Orders     None        Note:  This document was prepared using Dragon voice recognition software and may include unintentional dictation errors.   Lavonia Drafts, MD 07/30/22 [phone]

## 2022-07-30 NOTE — ED Triage Notes (Signed)
First Nurse Note:  Pt via EMS from home. Pt had a mechanical fall, pt loss her balance. Pt has a hx of Parkinson's Disease. Pt hit her head on refrigerator. 3-4cm laceration to the forehead. Denies blood thinners. Pt c/o "pressure" in her head. Pt is A&Ox4 and NAD

## 2022-08-10 ENCOUNTER — Encounter: Payer: Self-pay | Admitting: Emergency Medicine

## 2022-08-10 ENCOUNTER — Ambulatory Visit
Admission: EM | Admit: 2022-08-10 | Discharge: 2022-08-10 | Disposition: A | Discharge status: Home / Self Care | Payer: Medicare PPO

## 2022-08-10 ENCOUNTER — Ambulatory Visit: Payer: Self-pay

## 2022-08-10 DIAGNOSIS — M5431 Sciatica, right side: Secondary | ICD-10-CM | POA: Diagnosis not present

## 2022-08-10 DIAGNOSIS — Z4802 Encounter for removal of sutures: Secondary | ICD-10-CM

## 2022-08-10 NOTE — ED Triage Notes (Signed)
Pt presents to [place] for suture removal. She had 5 sutures placed in the right side of her forehead on 07/30/22 at [facility] ED.

## 2022-08-10 NOTE — Discharge Instructions (Signed)
-  All sutures today.  Wound looks good. - Your leg pain is likely related to sciatica especially since you have had it in the past.  It is okay for you to take low-dose of Aleve 1-2 times a day over the next week or 2 to see if that helps in addition to the Tylenol, heating pad and ice. - If symptoms or not improving over the next couple weeks or they worsen you should follow-up with your PCP or orthopedics.  If at any point you develop numbness or weakness in your leg, go to ER.

## 2022-08-10 NOTE — ED Provider Notes (Signed)
[facility]    CSN: [id number] Arrival date & time: 08/10/22  1243      History   Chief Complaint Chief Complaint  Patient presents with   Suture / Staple Removal    HPI Julie Lynn is a 82 y.o. female presenting for suture removal.  Patient had 5 sutures placed to the right forehead on 07/30/2022 at [facility] after an accidental fall.  She says the area has healed fine.  She reports there is a tingling sensation in the area but denies any pain, redness, swelling or drainage.  Patient also reporting pain of her posterior right thigh over the past 5 to 6 days.  Pain is worse with raising the leg.  No pain in her back, numbness/tingling or weakness of her leg.  No loss of bowel or bladder control.  Has taken Tylenol and used heat and ice without relief.  She says she has had similar symptoms in the past when she has had sciatica.  No other complaints.  HPI  Past Medical History:  Diagnosis Date   Anxiety    Cancer 05/2017   skin   Depression    GERD (gastroesophageal reflux disease)    Heartburn    Hepatitis A 1962   HLD (hyperlipidemia)    HTN (hypertension)    Hypertension    Parkinson disease    Scoliosis    Vertigo    rare    There are no problems to display for this patient.   Past Surgical History:  Procedure Laterality Date   ABDOMINAL HYSTERECTOMY  1977   ABDOMINAL HYSTERECTOMY     CATARACT EXTRACTION W/PHACO Left 11/01/2017   Procedure: CATARACT EXTRACTION PHACO AND INTRAOCULAR LENS PLACEMENT ([place]) LEFT;  Surgeon: Leandrew Koyanagi, MD;  Location: [place];  Service: Ophthalmology;  Laterality: Left;   CATARACT EXTRACTION W/PHACO Right 12/12/2018   Procedure: CATARACT EXTRACTION PHACO AND INTRAOCULAR LENS PLACEMENT ([place])  RIGHT;  Surgeon: Leandrew Koyanagi, MD;  Location: [place];  Service: Ophthalmology;  Laterality: Right;   COLONOSCOPY WITH PROPOFOL N/A 06/15/2018   Procedure: COLONOSCOPY WITH PROPOFOL;  Surgeon: Lollie Sails, MD;  Location: [facility] ENDOSCOPY;  Service: Endoscopy;  Laterality: N/A;   ESOPHAGOGASTRODUODENOSCOPY (EGD) WITH PROPOFOL N/A 06/15/2018   Procedure: ESOPHAGOGASTRODUODENOSCOPY (EGD) WITH PROPOFOL;  Surgeon: Lollie Sails, MD;  Location: [facility] ENDOSCOPY;  Service: Endoscopy;  Laterality: N/A;    OB History   No obstetric history on file.      Home Medications    Prior to Admission medications   Medication Sig Start Date End Date Taking? Authorizing Provider  amLODipine (NORVASC) 5 MG tablet Take 2.5 mg by mouth as needed.   Yes [provider]  carbidopa-levodopa (SINEMET IR) 25-250 MG tablet Take 2 tablets by mouth 4 (four) times daily. Pt a uses 25/200   Yes [provider]  ezetimibe (ZETIA) 10 MG tablet Take by mouth. 07/19/16  Yes [provider]  mirtazapine (REMERON) 7.5 MG tablet Take 7.5 mg by mouth at bedtime. 05/17/21  Yes [provider]  omeprazole (PRILOSEC) 20 MG capsule TAKE 1 CAPSULE DAILY 05/24/16  Yes [provider]  Vitamin D, Ergocalciferol, (DRISDOL) 50000 units CAPS capsule TAKE 1 CAPSULE ONCE A WEEK 03/08/16  Yes [provider]  meclizine (ANTIVERT) 12.5 MG tablet Take 1 tablet (12.5 mg total) by mouth every 8 (eight) hours as needed for dizziness. 04/20/21   Katy Apo, NP  mirabegron ER (MYRBETRIQ) 25 MG TB24 tablet Take  1 tablet (25 mg total) by mouth daily. 06/29/21   Billey Co, MD    Family History Family History  Problem Relation Age of Onset   Breast cancer Mother 75   Kidney cancer Neg Hx    Bladder Cancer Neg Hx     Social History Social History   Tobacco Use   Smoking status: Never   Smokeless tobacco: Never  Vaping Use   Vaping Use: Never used  Substance Use Topics   Alcohol use: Not Currently   Drug use: Never     Allergies   Tramadol, Nitrofuran derivatives, Simvastatin, Ultram [tramadol hcl], Adhesive [tape], and Wound dressing adhesive   Review of  Systems Review of Systems  Musculoskeletal:  Positive for arthralgias. Negative for back pain and gait problem.  Skin:  Positive for wound. Negative for color change.  Neurological:  Negative for weakness, numbness and headaches.     Physical Exam Triage Vital Signs ED Triage Vitals  Enc Vitals Group     BP 08/10/22 1304 (!) 155/80     Pulse Rate 08/10/22 1304 66     Resp 08/10/22 1304 16     Temp 08/10/22 1304 97.7 F (36.5 C)     Temp Source 08/10/22 1304 Oral     SpO2 08/10/22 1304 97 %     Weight 08/10/22 1303 117 lb 1 oz (53.1 kg)     Height 08/10/22 1303 5\' 2"  (1.575 m)     Head Circumference --      Peak Flow --      Pain Score 08/10/22 1302 0     Pain Loc --      Pain Edu? --      Excl. in [place]? --    No data found.  Updated Vital Signs BP (!) 155/80 (BP Location: Right Arm)   Pulse 66   Temp 97.7 F (36.5 C) (Oral)   Resp 16   Ht 5\' 2"  (1.575 m)   Wt 117 lb 1 oz (53.1 kg)   LMP  (LMP Unknown)   SpO2 97%   BMI 21.41 kg/m      Physical Exam Vitals and nursing note reviewed.  Constitutional:      General: She is not in acute distress.    Appearance: Normal appearance. She is not ill-appearing or toxic-appearing.  HENT:     Head: Normocephalic and atraumatic.  Eyes:     General: No scleral icterus.       Right eye: No discharge.        Left eye: No discharge.     Conjunctiva/sclera: Conjunctivae normal.  Cardiovascular:     Rate and Rhythm: Normal rate and regular rhythm.     Heart sounds: Normal heart sounds.  Pulmonary:     Effort: Pulmonary effort is normal. No respiratory distress.     Breath sounds: Normal breath sounds.  Musculoskeletal:     Cervical back: Neck supple.     Lumbar back: No bony tenderness. Normal range of motion. Positive right straight leg raise test. Negative left straight leg raise test.  Skin:    General: Skin is dry.     Comments: Healed linear laceration to the right forehead with 5 sutures in place.  No surrounding  swelling, redness, drainage and no tenderness.  Neurological:     General: No focal deficit present.     Mental Status: She is alert. Mental status is at baseline.     Motor: No weakness.  Gait: Gait normal.  Psychiatric:        Mood and Affect: Mood normal.        Behavior: Behavior normal.        Thought Content: Thought content normal.      UC Treatments / Results  Labs (all labs ordered are listed, but only abnormal results are displayed) Labs Reviewed - No data to display  EKG   Radiology No results found.  Procedures Procedures (including critical care time)  Medications Ordered in UC Medications - No data to display  Initial Impression / Assessment and Plan / UC Course  I have reviewed the triage vital signs and the nursing notes.  Pertinent labs & imaging results that were available during my care of the patient were reviewed by me and considered in my medical decision making (see chart for details).   82 year old female presents for suture removal as well as right posterior leg pain.  Clean the area with alcohol swab.  Remove 5 sutures.  Wound site is completely closed up and healed nicely.  Advised wound care.  Right posterior leg discomfort is consistent with sciatica.  Advised her to add low-dose Aleve and continue Tylenol, heat, ice, stretching.  Reviewed see PCP or Ortho if no improvement over the next week or 2 or if symptoms worsen.   Final Clinical Impressions(s) / UC Diagnoses   Final diagnoses:  Visit for suture removal  Sciatica, right side     Discharge Instructions      -All sutures today.  Wound looks good. - Your leg pain is likely related to sciatica especially since you have had it in the past.  It is okay for you to take low-dose of Aleve 1-2 times a day over the next week or 2 to see if that helps in addition to the Tylenol, heating pad and ice. - If symptoms or not improving over the next couple weeks or they worsen you should  follow-up with your PCP or orthopedics.  If at any point you develop numbness or weakness in your leg, go to ER.   ED Prescriptions   None    PDMP not reviewed this encounter.   Danton Clap, PA-C 08/10/22 1326

## 2022-09-06 ENCOUNTER — Ambulatory Visit: Payer: Self-pay

## 2022-09-06 ENCOUNTER — Emergency Department
Admission: EM | Admit: 2022-09-06 | Discharge: 2022-09-06 | Disposition: A | Discharge status: Home / Self Care | Payer: Medicare PPO | Attending: Emergency Medicine | Admitting: Emergency Medicine

## 2022-09-06 ENCOUNTER — Other Ambulatory Visit: Payer: Self-pay

## 2022-09-06 ENCOUNTER — Emergency Department: Payer: Medicare PPO

## 2022-09-06 DIAGNOSIS — S01312A Laceration without foreign body of left ear, initial encounter: Secondary | ICD-10-CM

## 2022-09-06 DIAGNOSIS — S0101XA Laceration without foreign body of scalp, initial encounter: Secondary | ICD-10-CM | POA: Diagnosis present

## 2022-09-06 DIAGNOSIS — G20C Parkinsonism, unspecified: Secondary | ICD-10-CM | POA: Insufficient documentation

## 2022-09-06 DIAGNOSIS — Z23 Encounter for immunization: Secondary | ICD-10-CM | POA: Diagnosis not present

## 2022-09-06 DIAGNOSIS — I10 Essential (primary) hypertension: Secondary | ICD-10-CM | POA: Insufficient documentation

## 2022-09-06 DIAGNOSIS — W19XXXA Unspecified fall, initial encounter: Secondary | ICD-10-CM

## 2022-09-06 DIAGNOSIS — W01198A Fall on same level from slipping, tripping and stumbling with subsequent striking against other object, initial encounter: Secondary | ICD-10-CM | POA: Insufficient documentation

## 2022-09-06 LAB — CBC
HCT: 39.9 % (ref 36.0–46.0)
Hemoglobin: 13.1 g/dL (ref 12.0–15.0)
MCH: 30.5 pg (ref 26.0–34.0)
MCHC: 32.8 g/dL (ref 30.0–36.0)
MCV: 92.8 fL (ref 80.0–100.0)
Platelets: 134 10*3/uL — ABNORMAL LOW (ref 150–400)
RBC: 4.3 MIL/uL (ref 3.87–5.11)
RDW: 13.1 % (ref 11.5–15.5)
WBC: 6.4 10*3/uL (ref 4.0–10.5)
nRBC: 0 % (ref 0.0–0.2)

## 2022-09-06 LAB — BASIC METABOLIC PANEL
Anion gap: 8 (ref 5–15)
BUN: 17 mg/dL (ref 8–23)
CO2: 25 mmol/L (ref 22–32)
Calcium: 8.7 mg/dL — ABNORMAL LOW (ref 8.9–10.3)
Chloride: 101 mmol/L (ref 98–111)
Creatinine, Ser: 0.82 mg/dL (ref 0.44–1.00)
GFR, Estimated: >60 mL/min (ref >60)
Glucose, Bld: 115 mg/dL — ABNORMAL HIGH (ref 70–99)
Potassium: 3.3 mmol/L — ABNORMAL LOW (ref 3.5–5.1)
Sodium: 134 mmol/L — ABNORMAL LOW (ref 135–145)

## 2022-09-06 MED ORDER — LIDOCAINE HCL (PF) 1 % IJ SOLN
INTRAMUSCULAR | Status: AC
  Filled 2022-09-06: qty 5

## 2022-09-06 MED ORDER — LIDOCAINE-EPINEPHRINE 2 %-1:100000 IJ SOLN
20.0000 mL | Freq: Once | INTRAMUSCULAR | Status: AC
  Administered 2022-09-06: 20 mL
  Filled 2022-09-06: qty 1

## 2022-09-06 MED ORDER — TETANUS-DIPHTH-ACELL PERTUSSIS 5-2.5-18.5 LF-MCG/0.5 IM SUSY
0.5000 mL | PREFILLED_SYRINGE | Freq: Once | INTRAMUSCULAR | Status: AC
  Administered 2022-09-06: 0.5 mL via INTRAMUSCULAR
  Filled 2022-09-06: qty 0.5

## 2022-09-06 MED ORDER — POTASSIUM CHLORIDE CRYS ER 20 MEQ PO TBCR
40.0000 meq | EXTENDED_RELEASE_TABLET | Freq: Once | ORAL | Status: AC
  Administered 2022-09-06: 40 meq via ORAL
  Filled 2022-09-06: qty 2

## 2022-09-06 NOTE — ED Triage Notes (Signed)
Pt comes with c/o fall. Pt stats she got dizzy and fell striking her head. Pt has bandage in place. Pt is not on thinners. Pt does have parkinsons.   IV in place

## 2022-09-06 NOTE — ED Triage Notes (Signed)
First RN note-  PT coming in for multiple falls. Pt is from home. Pt has parkinson's.   EMS vitals 100F CBG 152 123/54 HR 74 18G Right AC

## 2022-09-06 NOTE — ED Provider Notes (Signed)
[facility] Provider Note    Event Date/Time   First MD Initiated Contact with Patient 09/06/22 1152     (approximate)   History   Chief Complaint Fall   HPI  Julie Lynn is a 82 y.o. female with past medical history of hypertension, hyperlipidemia, and Parkinson's disease who presents to the ED following fall.  Patient reports that she was walking in her home when she lost her balance and fell forward, striking her head on a table.  She denies losing consciousness and does not take any blood thinners.  She reports pain around her left ear and her frontal scalp, denies neck pain, chest pain, or extremity pain.  She is at her baseline mental status per son at bedside.     Physical Exam   Triage Vital Signs: ED Triage Vitals  Enc Vitals Group     BP 09/06/22 1057 134/69     Pulse Rate 09/06/22 1057 86     Resp 09/06/22 1057 18     Temp 09/06/22 1057 98 F (36.7 C)     Temp src --      SpO2 09/06/22 1057 93 %     Weight --      Height --      Head Circumference --      Peak Flow --      Pain Score 09/06/22 1059 6     Pain Loc --      Pain Edu? --      Excl. in GC? --     Most recent vital signs: Vitals:   09/06/22 1057  BP: 134/69  Pulse: 86  Resp: 18  Temp: 98 F (36.7 C)  SpO2: 93%    Constitutional: Alert and oriented. Eyes: Conjunctivae are normal. Head: Approximately 6 cm linear frontal scalp laceration with no active bleeding.  Approximately 3 cm laceration to posterior earlobe on the left, no apparent cartilage involvement. Nose: No congestion/rhinnorhea. Mouth/Throat: Mucous membranes are moist.  Neck: No midline cervical spine tenderness to palpation. Cardiovascular: Normal rate, regular rhythm. Grossly normal heart sounds.  2+ radial pulses bilaterally. Respiratory: Normal respiratory effort.  No retractions. Lungs CTAB. Gastrointestinal: Soft and nontender. No distention. Musculoskeletal: No lower extremity tenderness  nor edema.  Neurologic:  Normal speech and language. No gross focal neurologic deficits are appreciated.    ED Results / Procedures / Treatments   Labs (all labs ordered are listed, but only abnormal results are displayed) Labs Reviewed  CBC - Abnormal; Notable for the following components:      Result Value   Platelets 134 (*)    All other components within normal limits  BASIC METABOLIC PANEL - Abnormal; Notable for the following components:   Sodium 134 (*)    Potassium 3.3 (*)    Glucose, Bld 115 (*)    Calcium 8.7 (*)    All other components within normal limits     EKG  ED ECG REPORT I, Chesley Noon, the attending physician, personally viewed and interpreted this ECG.   Date: 09/06/2022  EKG Time: 11:00  Rate: 86  Rhythm: normal sinus rhythm  Axis: Normal  Intervals:right bundle branch block  ST&T Change: None  RADIOLOGY CT head reviewed and interpreted by me with no hemorrhage or midline shift.  PROCEDURES:  Critical Care performed: No  ..Laceration Repair  Date/Time: 09/06/2022 1:34 PM  Performed by: Chesley Noon, MD Authorized by: Chesley Noon, MD   Consent:    Consent obtained:  Verbal   Consent given by:  Patient   Risks, benefits, and alternatives were discussed: yes   Universal protocol:    Patient identity confirmed:  Verbally with patient and arm band Anesthesia:    Anesthesia method:  Local infiltration   Local anesthetic:  Lidocaine 2% WITH epi Laceration details:    Location:  Scalp   Scalp location:  Frontal   Length (cm):  6 Pre-procedure details:    Preparation:  Patient was prepped and draped in usual sterile fashion and imaging obtained to evaluate for foreign bodies Exploration:    Limited defect created (wound extended): no     Hemostasis achieved with:  Direct pressure and epinephrine   Imaging obtained comment:  CT   Imaging outcome: foreign body not noted     Wound exploration: wound explored through full range of  motion and entire depth of wound visualized     Wound extent: areolar tissue not violated, fascia not violated, no foreign body, no signs of injury, no nerve damage, no tendon damage, no underlying fracture and no vascular damage     Contaminated: no   Treatment:    Area cleansed with:  Saline   Amount of cleaning:  Standard   Irrigation solution:  Sterile saline   Irrigation method:  Pressure wash   Visualized foreign bodies/material removed: no     Debridement:  None   Undermining:  None   Scar revision: no   Skin repair:    Repair method:  Sutures   Suture size:  5-0   Suture material:  Nylon   Suture technique:  Simple interrupted   Number of sutures:  5 Approximation:    Approximation:  Close Repair type:    Repair type:  Simple Post-procedure details:    Dressing:  Open (no dressing)   Procedure completion:  Tolerated well, no immediate complications .Marland KitchenLaceration Repair  Date/Time: 09/06/2022 1:35 PM  Performed by: Chesley Noon, MD Authorized by: Chesley Noon, MD   Consent:    Consent obtained:  Verbal   Consent given by:  Patient Universal protocol:    Patient identity confirmed:  Verbally with patient and arm band Anesthesia:    Anesthesia method:  Local infiltration   Local anesthetic:  Lidocaine 1% w/o epi Laceration details:    Location:  Ear   Ear location:  L ear   Length (cm):  3 Pre-procedure details:    Preparation:  Patient was prepped and draped in usual sterile fashion and imaging obtained to evaluate for foreign bodies Exploration:    Hemostasis achieved with:  Direct pressure   Imaging obtained comment:  CT   Imaging outcome: foreign body not noted     Wound exploration: wound explored through full range of motion and entire depth of wound visualized     Wound extent: areolar tissue not violated, fascia not violated, no foreign body, no signs of injury, no nerve damage, no tendon damage, no underlying fracture and no vascular damage      Contaminated: no   Treatment:    Area cleansed with:  Saline   Amount of cleaning:  Standard   Irrigation solution:  Sterile saline   Irrigation method:  Pressure wash   Debridement:  None   Undermining:  None   Scar revision: no   Skin repair:    Repair method:  Sutures   Suture size:  6-0   Suture material:  Nylon   Suture technique:  Simple interrupted   Number of sutures:  2 Approximation:    Approximation:  Loose Repair type:    Repair type:  Simple Post-procedure details:    Dressing:  Open (no dressing)   Procedure completion:  Tolerated well, no immediate complications    MEDICATIONS ORDERED IN ED: Medications  potassium chloride SA (KLOR-CON M) CR tablet 40 mEq (has no administration in time range)  Tdap (BOOSTRIX) injection 0.5 mL (has no administration in time range)  lidocaine-EPINEPHrine (XYLOCAINE W/EPI) 2 %-1:100000 (with pres) injection 20 mL (20 mLs Infiltration Given 09/06/22 1326)  lidocaine (PF) (XYLOCAINE) 1 % injection (  Given 09/06/22 1327)     IMPRESSION / MDM / ASSESSMENT AND PLAN / ED COURSE  I reviewed the triage vital signs and the nursing notes.                              82 y.o. female with past medical history of hypertension, hyperlipidemia, and Parkinson's disease who presents to the ED complaining of fall where she lost her balance and struck her head on a table.  Patient's presentation is most consistent with acute presentation with potential threat to life or bodily function.  Differential diagnosis includes, but is not limited to, intracranial hemorrhage, skull fracture, scalp laceration, ear laceration, extremity injury.  Patient nontoxic-appearing and in no acute distress, vital signs are unremarkable.  She has frontal scalp laceration but no midline cervical spine tenderness to palpation and no facial bony tenderness to palpation.  CT head and cervical spine are negative for acute process, lacerations to frontal scalp and left ear  were repaired, no evidence of cartilage injury to her ear.  No evidence of traumatic injury to her extremities, chest, or abdomen.  EKG shows no evidence of arrhythmia or ischemia, labs without anemia, leukocytosis, or AKI.  Patient does have mild hypokalemia that was repleted.  She is appropriate for discharge home with PCP follow-up in 1 week for suture removal.  Son at bedside counseled to have her return to the ED for new or worsening symptoms, patient and son agree with plan.      FINAL CLINICAL IMPRESSION(S) / ED DIAGNOSES   Final diagnoses:  Fall, initial encounter  Laceration of scalp, initial encounter  Laceration of left earlobe, initial encounter     Rx / DC Orders   ED Discharge Orders     None        Note:  This document was prepared using Dragon voice recognition software and may include unintentional dictation errors.   Chesley Noon, MD 09/06/22 1339

## 2022-10-25 ENCOUNTER — Other Ambulatory Visit: Payer: Self-pay

## 2022-10-25 ENCOUNTER — Emergency Department
Admission: EM | Admit: 2022-10-25 | Discharge: 2022-10-25 | Disposition: A | Discharge status: Home / Self Care | Payer: Medicare PPO | Attending: Emergency Medicine | Admitting: Emergency Medicine

## 2022-10-25 ENCOUNTER — Encounter: Payer: Self-pay | Admitting: *Deleted

## 2022-10-25 ENCOUNTER — Emergency Department: Payer: Medicare PPO

## 2022-10-25 ENCOUNTER — Ambulatory Visit
Admission: EM | Admit: 2022-10-25 | Discharge: 2022-10-25 | Disposition: A | Discharge status: Home / Self Care | Payer: Medicare PPO | Attending: Nurse Practitioner | Admitting: Nurse Practitioner

## 2022-10-25 DIAGNOSIS — S61431A Puncture wound without foreign body of right hand, initial encounter: Secondary | ICD-10-CM | POA: Insufficient documentation

## 2022-10-25 DIAGNOSIS — S0083XA Contusion of other part of head, initial encounter: Secondary | ICD-10-CM | POA: Diagnosis not present

## 2022-10-25 DIAGNOSIS — W540XXA Bitten by dog, initial encounter: Secondary | ICD-10-CM

## 2022-10-25 DIAGNOSIS — G20C Parkinsonism, unspecified: Secondary | ICD-10-CM | POA: Diagnosis not present

## 2022-10-25 DIAGNOSIS — S61451A Open bite of right hand, initial encounter: Secondary | ICD-10-CM

## 2022-10-25 DIAGNOSIS — I10 Essential (primary) hypertension: Secondary | ICD-10-CM | POA: Diagnosis not present

## 2022-10-25 DIAGNOSIS — L03113 Cellulitis of right upper limb: Secondary | ICD-10-CM | POA: Diagnosis not present

## 2022-10-25 DIAGNOSIS — W19XXXA Unspecified fall, initial encounter: Secondary | ICD-10-CM

## 2022-10-25 DIAGNOSIS — Z85828 Personal history of other malignant neoplasm of skin: Secondary | ICD-10-CM | POA: Diagnosis not present

## 2022-10-25 DIAGNOSIS — W0110XA Fall on same level from slipping, tripping and stumbling with subsequent striking against unspecified object, initial encounter: Secondary | ICD-10-CM | POA: Insufficient documentation

## 2022-10-25 DIAGNOSIS — S6991XA Unspecified injury of right wrist, hand and finger(s), initial encounter: Secondary | ICD-10-CM | POA: Diagnosis present

## 2022-10-25 DIAGNOSIS — S0003XA Contusion of scalp, initial encounter: Secondary | ICD-10-CM | POA: Insufficient documentation

## 2022-10-25 DIAGNOSIS — S00531A Contusion of lip, initial encounter: Secondary | ICD-10-CM | POA: Diagnosis not present

## 2022-10-25 LAB — CBC
HCT: 39.8 % (ref 36.0–46.0)
Hemoglobin: 13.3 g/dL (ref 12.0–15.0)
MCH: 31.5 pg (ref 26.0–34.0)
MCHC: 33.4 g/dL (ref 30.0–36.0)
MCV: 94.3 fL (ref 80.0–100.0)
Platelets: 217 10*3/uL (ref 150–400)
RBC: 4.22 MIL/uL (ref 3.87–5.11)
RDW: 13.2 % (ref 11.5–15.5)
WBC: 8.1 10*3/uL (ref 4.0–10.5)
nRBC: 0 % (ref 0.0–0.2)

## 2022-10-25 LAB — BASIC METABOLIC PANEL
Anion gap: 13 (ref 5–15)
BUN: 23 mg/dL (ref 8–23)
CO2: 27 mmol/L (ref 22–32)
Calcium: 9.1 mg/dL (ref 8.9–10.3)
Chloride: 105 mmol/L (ref 98–111)
Creatinine, Ser: 0.82 mg/dL (ref 0.44–1.00)
GFR, Estimated: >60 mL/min (ref >60)
Glucose, Bld: 95 mg/dL (ref 70–99)
Potassium: 3.6 mmol/L (ref 3.5–5.1)
Sodium: 142 mmol/L (ref 135–145)

## 2022-10-25 LAB — TROPONIN I (HIGH SENSITIVITY)
Troponin I (High Sensitivity): 4 ng/L (ref <18)
Troponin I (High Sensitivity): 4 ng/L (ref <18)

## 2022-10-25 MED ORDER — AMLODIPINE BESYLATE 5 MG PO TABS
2.5000 mg | ORAL_TABLET | Freq: Once | ORAL | Status: AC
  Administered 2022-10-25: 2.5 mg via ORAL
  Filled 2022-10-25: qty 1

## 2022-10-25 MED ORDER — CARBIDOPA-LEVODOPA 25-250 MG PO TABS
1.0000 | ORAL_TABLET | Freq: Once | ORAL | Status: AC
  Administered 2022-10-25: 1 via ORAL
  Filled 2022-10-25: qty 1

## 2022-10-25 MED ORDER — ACETAMINOPHEN 500 MG PO TABS
1000.0000 mg | ORAL_TABLET | Freq: Once | ORAL | Status: AC
  Administered 2022-10-25: 1000 mg via ORAL
  Filled 2022-10-25: qty 2

## 2022-10-25 MED ORDER — AMOXICILLIN-POT CLAVULANATE 875-125 MG PO TABS
1.0000 | ORAL_TABLET | Freq: Two times a day (BID) | ORAL | 0 refills | Status: DC
Start: 2022-10-25 — End: 2023-07-22

## 2022-10-25 NOTE — ED Notes (Signed)
Pt in CT.

## 2022-10-25 NOTE — ED Provider Notes (Signed)
[facility] Provider Note    Event Date/Time   First MD Initiated Contact with Patient 10/25/22 1659     (approximate)   History   Fall   HPI  Julie Lynn is a 82 y.o. female past medical history of Parkinson's who presents after a fall.  Patient was sitting in a chair when the chair collapsed and caused her to fall to the ground hitting her head.  Her caregiver told her daughter that she lost consciousness for about 40 seconds.  Patient denies significant head pain denies nausea vomiting.  Her daughter notes she is at her baseline currently.  She denies neck or back pain.  She was bit by her dog in the hand this morning and they actually were seen in urgent care and given antibiotic.  Patient denies other issues.  No nausea vomiting fevers or chills     Past Medical History:  Diagnosis Date   Anxiety    Cancer (HCC) 05/2017   skin   Depression    GERD (gastroesophageal reflux disease)    Heartburn    Hepatitis A 1962   HLD (hyperlipidemia)    HTN (hypertension)    Hypertension    Parkinson disease    Scoliosis    Vertigo    rare    There are no problems to display for this patient.    Physical Exam  Triage Vital Signs: ED Triage Vitals  Enc Vitals Group     BP 10/25/22 1527 139/69     Pulse Rate 10/25/22 1527 69     Resp 10/25/22 1527 18     Temp 10/25/22 1527 98 F (36.7 C)     Temp Source 10/25/22 1527 Oral     SpO2 10/25/22 1527 100 %     Weight 10/25/22 1525 113 lb (51.3 kg)     Height 10/25/22 1525 5' (1.524 m)     Head Circumference --      Peak Flow --      Pain Score 10/25/22 1525 4     Pain Loc --      Pain Edu? --      Excl. in GC? --     Most recent vital signs: Vitals:   10/25/22 1730 10/25/22 1855  BP: (!) 171/66 (!) 182/74  Pulse: 64 61  Resp: 13 13  Temp:    SpO2: 98% 97%     General: Awake, no distress.  CV:  Good peripheral perfusion.  Resp:  Normal effort.  Abd:  No distention.  Neuro:              Awake, Alert, Oriented x 3  Other:  Patient has old appearing ecchymosis periorbitally and on her cheeks and upper lip There is a hematoma in the posterior occiput no laceration No midline C-spine tenderness, mild T and L-spine tenderness but no step-off She has 5 out of 5 strength in bilateral upper and lower extremities Abdominal exam is soft nontender Pelvis is stable Patient able to range both hips without difficulty Patient has a superficial puncture wound between the second and third metacarpals distally and there is some swelling of the second and third knuckles dorsally no exposed tendon or bone    ED Results / Procedures / Treatments  Labs (all labs ordered are listed, but only abnormal results are displayed) Labs Reviewed  BASIC METABOLIC PANEL  CBC  TROPONIN I (HIGH SENSITIVITY)  TROPONIN I (HIGH SENSITIVITY)     EKG  RADIOLOGY I reviewed and interpreted the CT scan of the brain which does not show any acute intracranial process    PROCEDURES:  Critical Care performed: No  Procedures  The patient is on the cardiac monitor to evaluate for evidence of arrhythmia and/or significant heart rate changes.   MEDICATIONS ORDERED IN ED: Medications  carbidopa-levodopa (SINEMET IR) 25-250 MG per tablet immediate release 1 tablet (1 tablet Oral Given 10/25/22 1842)  acetaminophen (TYLENOL) tablet 1,000 mg (1,000 mg Oral Given 10/25/22 1842)  amLODipine (NORVASC) tablet 2.5 mg (2.5 mg Oral Given 10/25/22 1913)     IMPRESSION / MDM / ASSESSMENT AND PLAN / ED COURSE  I reviewed the triage vital signs and the nursing notes.                              Patient's presentation is most consistent with acute presentation with potential threat to life or bodily function.  Differential diagnosis includes, but is not limited to, intracranial hemorrhage, skull fracture, cervical spine fracture, contusion, concussion  The patient is an 82 year old female with  Parkinson's disease who presents after a chair that she was sitting collapsed causing her to fall to the ground.  Patient has had multiple falls recently and actually fell several days ago was seen at outside hospital and this is why she has old appearing bruising on her face.  They were told she broke her nose.  Today she was just sitting in the chair when the chair collapsed causing her to fall to the ground and hit her head but she did lose consciousness.  She has been at her baseline since no nausea vomiting no significant head pain and she is not on a blood thinner.  On exam she does have some swelling in the posterior occiput without laceration.  She has symmetric ecchymosis orbital region and of her cheeks and again this is not new today.  No midline C-spine tenderness she does say yes to pain with palpation of the T and L-spine however she has no complaint of back pain when I am not palpating there and there is no step-off or ecchymosis.  Pelvis is stable.  Of note patient was also bit in her hand by the dog before this happened and was seen at urgent care.  She has a small puncture wound on the palmar aspect of her hand between the second and third metacarpal heads and there is some swelling over the second and third knuckles.  I did obtain an x-ray of the hand to evaluate for foreign body or fracture and this is negative.  Patient has already been prescribed antibiotics for prophylaxis by urgent care.  Did obtain CT head and C-spine these are negative for acute abnormality.  Also obtain screening x-rays of the T and L-spine knees did not show any obvious fracture and clinically my suspicion is low.  Labs were drawn from triage including a CBC BMP and troponin for somewhat unclear reasons but these are all reassuring as well.  Patient's initial EKG had some artifact but looks similar to prior with a right bundle branch block, and on repeat artifact is improved and appears normal with right bundle  branch block.  Patient is otherwise at her baseline I do think is appropriate to be discharged.       FINAL CLINICAL IMPRESSION(S) / ED DIAGNOSES   Final diagnoses:  Fall, initial encounter     Rx / DC Orders  ED Discharge Orders     None        Note:  This document was prepared using Dragon voice recognition software and may include unintentional dictation errors.   Georga Hacking, MD 10/25/22 [phone]

## 2022-10-25 NOTE — ED Provider Notes (Signed)
[facility]    CSN: [id number] Arrival date & time: 10/25/22  1057      History   Chief Complaint Chief Complaint  Patient presents with   Puncture Wound    Dog bite - Entered by patient   Animal Bite    HPI Julie Lynn is a 82 y.o. female presents for evaluation of a dog bite.  Patient is accompanied by daughter who is primary caregiver.  Today while feeding her dog she was bitten on her right hand and sustained 2 puncture wounds.  They did clean it at home but brought her in for evaluation.  The dog is up-to-date on its vaccines including rabies.  Patient is up-to-date on tetanus from April 2024.  Daughter reports patient did have a fall recently and does have several scratches and abrasions from this.  She was seen in the emergency room after the fall on June 11.  No other concerns at this time.   Animal Bite   Past Medical History:  Diagnosis Date   Anxiety    Cancer (HCC) 05/2017   skin   Depression    GERD (gastroesophageal reflux disease)    Heartburn    Hepatitis A 1962   HLD (hyperlipidemia)    HTN (hypertension)    Hypertension    Parkinson disease    Scoliosis    Vertigo    rare    There are no problems to display for this patient.   Past Surgical History:  Procedure Laterality Date   ABDOMINAL HYSTERECTOMY  1977   ABDOMINAL HYSTERECTOMY     CATARACT EXTRACTION W/PHACO Left 11/01/2017   Procedure: CATARACT EXTRACTION PHACO AND INTRAOCULAR LENS PLACEMENT (IOC) LEFT;  Surgeon: Lockie Mola, MD;  Location: [facility];  Service: Ophthalmology;  Laterality: Left;   CATARACT EXTRACTION W/PHACO Right 12/12/2018   Procedure: CATARACT EXTRACTION PHACO AND INTRAOCULAR LENS PLACEMENT (IOC)  RIGHT;  Surgeon: Lockie Mola, MD;  Location: [facility];  Service: Ophthalmology;  Laterality: Right;   COLONOSCOPY WITH PROPOFOL N/A 06/15/2018   Procedure: COLONOSCOPY WITH PROPOFOL;  Surgeon: Christena Deem, MD;  Location:  [facility];  Service: Endoscopy;  Laterality: N/A;   ESOPHAGOGASTRODUODENOSCOPY (EGD) WITH PROPOFOL N/A 06/15/2018   Procedure: ESOPHAGOGASTRODUODENOSCOPY (EGD) WITH PROPOFOL;  Surgeon: Christena Deem, MD;  Location: [facility] END[facility]COPY;  Service: Endoscopy;  Laterality: N/A;    OB History   No obstetric history on file.      Home Medications    Prior to Admission medications   Medication Sig Start Date End Date Taking? Authorizing Provider  amLODipine (NORVASC) 5 MG tablet Take 2.5 mg by mouth as needed.   Yes [provider]  amoxicillin-clavulanate (AUGMENTIN) 875-125 MG tablet Take 1 tablet by mouth every 12 (twelve) hours. 10/25/22  Yes Radford Pax, NP  carbidopa-levodopa (SINEMET IR) 25-250 MG tablet Take 2 tablets by mouth 4 (four) times daily. Pt a uses 25/200   Yes [provider]  ezetimibe (ZETIA) 10 MG tablet Take by mouth. 07/19/16  Yes [provider]  meclizine (ANTIVERT) 12.5 MG tablet Take 1 tablet (12.5 mg total) by mouth every 8 (eight) hours as needed for dizziness. 04/20/21  Yes Amyot, Ali Lowe, NP  mirabegron ER (MYRBETRIQ) 25 MG TB24 tablet Take 1 tablet (25 mg total) by mouth daily. 06/29/21  Yes Sondra Come, MD  mirtazapine (REMERON) 7.5 MG tablet Take 7.5 mg by mouth at bedtime. 05/17/21  Yes [provider]  omeprazole (PRILOSEC) 20  MG capsule TAKE 1 CAPSULE DAILY 05/24/16  Yes [provider]  Vitamin D, Ergocalciferol, (DRISDOL) 50000 units CAPS capsule TAKE 1 CAPSULE ONCE A WEEK 03/08/16  Yes [provider]    Family History Family History  Problem Relation Age of Onset   Breast cancer Mother 16   Kidney cancer Neg Hx    Bladder Cancer Neg Hx     Social History Social History   Tobacco Use   Smoking status: Never   Smokeless tobacco: Never  Vaping Use   Vaping Use: Never used  Substance Use Topics   Alcohol use: Not Currently   Drug use: Never     Allergies   Tramadol, Nitrofuran  derivatives, Simvastatin, Ultram [tramadol hcl], Adhesive [tape], and Wound dressing adhesive   Review of Systems Review of Systems  Skin:        Dog bite to right hand     Physical Exam Triage Vital Signs ED Triage Vitals  Enc Vitals Group     BP 10/25/22 1113 (!) 199/81     Pulse Rate 10/25/22 1113 77     Resp 10/25/22 1113 20     Temp 10/25/22 1113 98.1 F (36.7 C)     Temp src --      SpO2 --      Weight --      Height --      Head Circumference --      Peak Flow --      Pain Score 10/25/22 1110 2     Pain Loc --      Pain Edu? --      Excl. in GC? --    No data found.  Updated Vital Signs BP (!) 163/67 (BP Location: Left Arm)   Pulse 77   Temp 98.1 F (36.7 C)   Resp 20   LMP  (LMP Unknown)   Visual Acuity Right Eye Distance:   Left Eye Distance:   Bilateral Distance:    Right Eye Near:   Left Eye Near:    Bilateral Near:     Physical Exam Vitals and nursing note reviewed.  Constitutional:      General: She is not in acute distress.    Appearance: Normal appearance. She is not ill-appearing.  HENT:     Head: Normocephalic and atraumatic.     Comments: Patient does have some fading ecchymosis around both eyes Eyes:     Pupils: Pupils are equal, round, and reactive to light.  Cardiovascular:     Rate and Rhythm: Normal rate.  Pulmonary:     Effort: Pulmonary effort is normal.  Musculoskeletal:       Hands:     Comments: 2 puncture wounds to the right hand 1 on the dorsum of the hand near wrist and the other is on the palmar aspect between the first and second metacarpals.  Mild bleeding but no swelling or drainage.  There will several scratches to the hand that appear to be healing.  Cap refill +2 in all digits.  Skin:    General: Skin is warm and dry.  Neurological:     General: No focal deficit present.     Mental Status: She is alert and oriented to person, place, and time.  Psychiatric:        Mood and Affect: Mood normal.         Behavior: Behavior normal.      UC Treatments / Results  Labs (all labs ordered are  listed, but only abnormal results are displayed) Labs Reviewed - No data to display  EKG   Radiology No results found.  Procedures Procedures (including critical care time)  Medications Ordered in UC Medications - No data to display  Initial Impression / Assessment and Plan / UC Course  I have reviewed the triage vital signs and the nursing notes.  Pertinent labs & imaging results that were available during my care of the patient were reviewed by me and considered in my medical decision making (see chart for details).     Reviewed injury with patient and daughter.  BP elevated on intake but improved on recheck.  Wounds were cleansed and dressed by nursing staff.  Will start Augmentin twice daily for 7 days.  Patient is up-to-date on her tetanus.  Signs and symptoms of infection as well as wound care were reviewed.  Recommend PCP follow-up 2 days for recheck.  ER precautions reviewed and daughter and patient verbalized understanding Final Clinical Impressions(s) / UC Diagnoses   Final diagnoses:  Dog bite of right hand, initial encounter  Cellulitis of right upper extremity     Discharge Instructions      Keep the wounds clean and dry Start Augmentin twice daily for 7 days Follow-up with your PCP in 2 days for recheck Please go to the ER if there is any worsening symptoms    ED Prescriptions     Medication Sig Dispense Auth. Provider   amoxicillin-clavulanate (AUGMENTIN) 875-125 MG tablet Take 1 tablet by mouth every 12 (twelve) hours. 14 tablet Radford Pax, NP      PDMP not reviewed this encounter.   Radford Pax, NP 10/25/22 1158

## 2022-10-25 NOTE — Discharge Instructions (Signed)
The CAT scan of your head and neck did not show any acute injuries.  Additionally we obtained x-rays of your thoracic and lumbar spine which did not show any broken bones and x-ray of your right hand where you are bitten by the dog and there is no retained foreign body or broken bone.  Your blood work including a CBC BMP and EKG were all reassuring.

## 2022-10-25 NOTE — ED Triage Notes (Addendum)
Today pt was bitten in the palm of right hand by her own dog. Pt has multiple superficial abrasions to knuckles and puncture wound to palm of hand. Bleeding controlled able to move digits and hand w/o diff. (Wounds cleaned)

## 2022-10-25 NOTE — ED Triage Notes (Addendum)
Pt brought in by daughter.  Pt lives at home  pt has parkinson's.  Pt has had recent falls and today the chair collapsed and pt struck the back  of her head on the floor.  States loc today.  Hematoma to head.  bruising to face from a fall last week.   Pt alert.    No blood thinners

## 2022-10-25 NOTE — Discharge Instructions (Signed)
Keep the wounds clean and dry Start Augmentin twice daily for 7 days Follow-up with your PCP in 2 days for recheck Please go to the ER if there is any worsening symptoms

## 2022-10-26 ENCOUNTER — Ambulatory Visit
Admission: EM | Admit: 2022-10-26 | Discharge: 2022-10-26 | Payer: Medicare PPO | Attending: Family Medicine | Admitting: Family Medicine

## 2022-10-26 DIAGNOSIS — R296 Repeated falls: Secondary | ICD-10-CM | POA: Diagnosis not present

## 2022-10-26 DIAGNOSIS — S51012A Laceration without foreign body of left elbow, initial encounter: Secondary | ICD-10-CM

## 2022-10-26 NOTE — ED Provider Notes (Signed)
[facility]    CSN: [id number] Arrival date & time: 10/26/22  1735      History   Chief Complaint Chief Complaint  Patient presents with   Laceration    Left arm    HPI Julie Lynn is a 82 y.o. female.   HPI   Julie Lynn presents after a fall. She has an injury to her left elbow. She was making the bed and fell backward hitting her nightstand. She does not live alone. The family state Julie Lynn has a new nurse that will be helping her during the day.  She has fallen several times in the last week. She reports someone told her to fall backwards and they will catch her but no one was there.   She takes her own medication and reports she has not missed any doses. Has Parkinson disease.      Past Medical History:  Diagnosis Date   Anxiety    Cancer (HCC) 05/2017   skin   Depression    GERD (gastroesophageal reflux disease)    Heartburn    Hepatitis A 1962   HLD (hyperlipidemia)    HTN (hypertension)    Hypertension    Parkinson disease    Scoliosis    Vertigo    rare    There are no problems to display for this patient.   Past Surgical History:  Procedure Laterality Date   ABDOMINAL HYSTERECTOMY  1977   ABDOMINAL HYSTERECTOMY     CATARACT EXTRACTION W/PHACO Left 11/01/2017   Procedure: CATARACT EXTRACTION PHACO AND INTRAOCULAR LENS PLACEMENT (IOC) LEFT;  Surgeon: Lockie Mola, MD;  Location: [facility];  Service: Ophthalmology;  Laterality: Left;   CATARACT EXTRACTION W/PHACO Right 12/12/2018   Procedure: CATARACT EXTRACTION PHACO AND INTRAOCULAR LENS PLACEMENT (IOC)  RIGHT;  Surgeon: Lockie Mola, MD;  Location: [facility];  Service: Ophthalmology;  Laterality: Right;   COLONOSCOPY WITH PROPOFOL N/A 06/15/2018   Procedure: COLONOSCOPY WITH PROPOFOL;  Surgeon: Christena Deem, MD;  Location: [facility] ENDOSCOPY;  Service: Endoscopy;  Laterality: N/A;   ESOPHAGOGASTRODUODENOSCOPY (EGD) WITH PROPOFOL N/A 06/15/2018   Procedure:  ESOPHAGOGASTRODUODENOSCOPY (EGD) WITH PROPOFOL;  Surgeon: Christena Deem, MD;  Location: [facility] ENDOSCOPY;  Service: Endoscopy;  Laterality: N/A;    OB History   No obstetric history on file.      Home Medications    Prior to Admission medications   Medication Sig Start Date End Date Taking? Authorizing Provider  amLODipine (NORVASC) 5 MG tablet Take 2.5 mg by mouth as needed.   Yes [provider]  amoxicillin-clavulanate (AUGMENTIN) 875-125 MG tablet Take 1 tablet by mouth every 12 (twelve) hours. 10/25/22  Yes Radford Pax, NP  carbidopa-levodopa (SINEMET IR) 25-250 MG tablet Take 2 tablets by mouth 4 (four) times daily. Pt a uses 25/200   Yes [provider]  ezetimibe (ZETIA) 10 MG tablet Take by mouth. 07/19/16  Yes [provider]  meclizine (ANTIVERT) 12.5 MG tablet Take 1 tablet (12.5 mg total) by mouth every 8 (eight) hours as needed for dizziness. 04/20/21  Yes Amyot, Ali Lowe, NP  mirabegron ER (MYRBETRIQ) 25 MG TB24 tablet Take 1 tablet (25 mg total) by mouth daily. 06/29/21  Yes Sondra Come, MD  mirtazapine (REMERON) 7.5 MG tablet Take 7.5 mg by mouth at bedtime. 05/17/21  Yes [provider]  omeprazole (PRILOSEC) 20 MG capsule TAKE 1 CAPSULE DAILY 05/24/16  Yes [provider]  Vitamin D, Ergocalciferol, (DRISDOL) 50000 units CAPS  capsule TAKE 1 CAPSULE ONCE A WEEK 03/08/16  Yes [provider]    Family History Family History  Problem Relation Age of Onset   Breast cancer Mother 80   Kidney cancer Neg Hx    Bladder Cancer Neg Hx     Social History Social History   Tobacco Use   Smoking status: Never   Smokeless tobacco: Never  Vaping Use   Vaping Use: Never used  Substance Use Topics   Alcohol use: Not Currently   Drug use: Never     Allergies   Tramadol, Nitrofuran derivatives, Simvastatin, Ultram [tramadol hcl], Adhesive [tape], and Wound dressing adhesive   Review of Systems Review of  Systems: negative unless otherwise stated in HPI.      Physical Exam Triage Vital Signs ED Triage Vitals  Enc Vitals Group     BP 10/26/22 1742 (!) 125/59     Pulse Rate 10/26/22 1742 83     Resp --      Temp 10/26/22 1742 98.7 F (37.1 C)     Temp Source 10/26/22 1742 Oral     SpO2 10/26/22 1742 99 %     Weight 10/26/22 1740 115 lb (52.2 kg)     Height 10/26/22 1740 5\' 2"  (1.575 m)     Head Circumference --      Peak Flow --      Pain Score 10/26/22 1740 5     Pain Loc --      Pain Edu? --      Excl. in GC? --    No data found.  Updated Vital Signs BP (!) 125/59 (BP Location: Right Arm)   Pulse 83   Temp 98.7 F (37.1 C) (Oral)   Ht 5\' 2"  (1.575 m)   Wt 52.2 kg   LMP  (LMP Unknown)   SpO2 99%   BMI 21.03 kg/m   Visual Acuity Right Eye Distance:   Left Eye Distance:   Bilateral Distance:    Right Eye Near:   Left Eye Near:    Bilateral Near:     Physical Exam GEN:     alert, chronically-ill appearing female with multiple bruises of various stages HENT:  mucus membranes moist, oropharyngeal without lesions or erythema, nares patent, no nasal discharge, bilateral facial bruising around eyes and nose extending to cheeks, abrasions present  EYES:   pupils equal and reactive, EOM intact NECK:  supple, normal ROM, no lymphadenopathy  RESP:   Speaking in full sentences without pause, no increased work of breathing  CVS:   regular rate, distal pulse intact  EXT:   Right hand edema, LUE held in extension with lacerations to left elbow as below NEURO:  normal without focal findings,  speech normal, alert and oriented   Skin:   6 cm elbow laceration with muscle and bone exposure, 2 cm laceration above, purple discoloration to her feet, multiple abrasions and ecchymosis          UC Treatments / Results  Labs (all labs ordered are listed, but only abnormal results are displayed) Labs Reviewed - No data to display  EKG  If EKG performed, see my interpretation  in the MDM section  Radiology    Procedures Procedures (including critical care time)  Medications Ordered in UC Medications - No data to display  Initial Impression / Assessment and Plan / UC Course  I have reviewed the triage vital signs and the nursing notes.  Pertinent labs & imaging results that  were available during my care of the patient were reviewed by me and considered in my medical decision making (see chart for details).       Patient is a 82 y.o. female  who presents after a fall with injury to her left elbow/forearm.  Overall patient is chronically ill-appearing and afebrile.  Vital signs stable. On chart review, pt has been seen 4 times at the urgent care and ED for fall and a dog bite in the past week. She has had 3 falls. She has a nasal fracture. Reviewed CT Head and neck, lumbar-thoracic spine, and right hand x-rays. CBC, BMP and Troponin's were all normal.   On exam, she has exposed bone and muscle when the flap is manipulated upward. I suspect this may need to be washed out and may need IV antibiotics. She has had multiple falls therefore an admission may also be warranted.   Recommended ED evaluation with patient and her family who agree with this plan. Wound  wrapped in sterile guaze. She will travel via private vehicle to [facility].    Discussed MDM, treatment plan and plan for follow-up with patient and her family who agree with plan.    Final Clinical Impressions(s) / UC Diagnoses   Final diagnoses:  Recurrent falls  Laceration of left elbow, initial encounter     Discharge Instructions      I am concerned that your bone is exposed and will need a washout of the joint. You may need to be admitted for the frequent falls as well.    You have been advised to follow up immediately in the emergency department for concerning signs or symptoms as discussed during your visit. If you declined EMS transport, please have a family member take you directly  to the ED at this time. Do not delay.   Based on concerns about condition, if you do not follow up in the ED, you may risk poor outcomes including worsening of condition, delayed treatment and potentially life threatening issues. If you have declined to go to the ED at this time, you should call your PCP immediately to set up a follow up appointment.   Go to ED for red flag symptoms, including; fevers you cannot reduce with Tylenol/Motrin, severe headaches, vision changes, numbness/weakness in part of the body, lethargy, confusion, intractable vomiting, severe dehydration, chest pain, breathing difficulty, severe persistent abdominal or pelvic pain, signs of severe infection (increased redness, swelling of an area), feeling faint or passing out, dizziness, etc. You should especially go to the ED for sudden acute worsening of condition if you do not elect to go at this time.      ED Prescriptions   None    PDMP not reviewed this encounter.   Katha Cabal, DO 10/26/22 1845

## 2022-10-26 NOTE — Discharge Instructions (Signed)
I am concerned that your bone is exposed and will need a washout of the joint. You may need to be admitted for the frequent falls as well.    You have been advised to follow up immediately in the emergency department for concerning signs or symptoms as discussed during your visit. If you declined EMS transport, please have a family member take you directly to the ED at this time. Do not delay.   Based on concerns about condition, if you do not follow up in the ED, you may risk poor outcomes including worsening of condition, delayed treatment and potentially life threatening issues. If you have declined to go to the ED at this time, you should call your PCP immediately to set up a follow up appointment.   Go to ED for red flag symptoms, including; fevers you cannot reduce with Tylenol/Motrin, severe headaches, vision changes, numbness/weakness in part of the body, lethargy, confusion, intractable vomiting, severe dehydration, chest pain, breathing difficulty, severe persistent abdominal or pelvic pain, signs of severe infection (increased redness, swelling of an area), feeling faint or passing out, dizziness, etc. You should especially go to the ED for sudden acute worsening of condition if you do not elect to go at this time.

## 2022-10-26 NOTE — ED Triage Notes (Signed)
Patient report that she was taking a nap today woke up and while she was trying to pull some covers on the bed and hit her left arm on the table.

## 2022-10-26 NOTE — ED Notes (Signed)
Patient is being discharged from the Urgent Care and sent to the Emergency Department via POV . Per Katha Cabal, DO, patient is in need of higher level of care due to laceration of elbow. Patient is aware and verbalizes understanding of plan of care.  Vitals:   10/26/22 1742  BP: (!) 125/59  Pulse: 83  Temp: 98.7 F (37.1 C)  SpO2: 99%

## 2023-02-16 ENCOUNTER — Ambulatory Visit
Admission: EM | Admit: 2023-02-16 | Discharge: 2023-02-16 | Disposition: A | Discharge status: Home / Self Care | Payer: Medicare PPO | Attending: Internal Medicine | Admitting: Internal Medicine

## 2023-02-16 ENCOUNTER — Ambulatory Visit (INDEPENDENT_AMBULATORY_CARE_PROVIDER_SITE_OTHER): Payer: Medicare PPO

## 2023-02-16 DIAGNOSIS — R0781 Pleurodynia: Secondary | ICD-10-CM | POA: Diagnosis not present

## 2023-02-16 NOTE — ED Provider Notes (Signed)
[facility]    CSN: [id number] Arrival date & time: 02/16/23  1355      History   Chief Complaint Chief Complaint  Patient presents with   Flank Pain    HPI Julie Lynn is a 82 y.o. female presents with daughter for evaluation of right rib/side pain.  Daughter states patient was with caregiver at the hairdresser 2 hours PTA when she went to get into the chair and had a sharp pain on her right lateral mid side/lower ribs that caused her to "yelp out".  Since then it has been intermittent and there is no correlation between onset of pain and movement or position.  Denies any known injury including fall.  Daughter states she fell a week ago but had not been reporting any pain.  No dysuria and daughter states she is currently on cephalexin for hematuria by her PCP.  No fevers, flank pain, nausea/vomiting or chills.  No shortness of breath, hemoptysis.  No history of rib fractures in the past.  Patient states she took Tylenol prior to the incident for unclear reasons, but nothing since.  No other concerns at this time.   Flank Pain    Past Medical History:  Diagnosis Date   Anxiety    Cancer (HCC) 05/2017   skin   Depression    GERD (gastroesophageal reflux disease)    Heartburn    Hepatitis A 1962   HLD (hyperlipidemia)    HTN (hypertension)    Hypertension    Parkinson disease (HCC)    Scoliosis    Vertigo    rare    There are no problems to display for this patient.   Past Surgical History:  Procedure Laterality Date   ABDOMINAL HYSTERECTOMY  1977   ABDOMINAL HYSTERECTOMY     CATARACT EXTRACTION W/PHACO Left 11/01/2017   Procedure: CATARACT EXTRACTION PHACO AND INTRAOCULAR LENS PLACEMENT (IOC) LEFT;  Surgeon: Lockie Mola, MD;  Location: [facility];  Service: Ophthalmology;  Laterality: Left;   CATARACT EXTRACTION W/PHACO Right 12/12/2018   Procedure: CATARACT EXTRACTION PHACO AND INTRAOCULAR LENS PLACEMENT (IOC)  RIGHT;  Surgeon:  Lockie Mola, MD;  Location: [facility];  Service: Ophthalmology;  Laterality: Right;   COLONOSCOPY WITH PROPOFOL N/A 06/15/2018   Procedure: COLONOSCOPY WITH PROPOFOL;  Surgeon: Christena Deem, MD;  Location: [facility] ENDOSCOPY;  Service: Endoscopy;  Laterality: N/A;   ESOPHAGOGASTRODUODENOSCOPY (EGD) WITH PROPOFOL N/A 06/15/2018   Procedure: ESOPHAGOGASTRODUODENOSCOPY (EGD) WITH PROPOFOL;  Surgeon: Christena Deem, MD;  Location: [facility] ENDOSCOPY;  Service: Endoscopy;  Laterality: N/A;    OB History   No obstetric history on file.      Home Medications    Prior to Admission medications   Medication Sig Start Date End Date Taking? Authorizing Provider  cephALEXin (KEFLEX) 500 MG capsule Take by mouth. 02/13/23 02/18/23 Yes [provider]  amLODipine (NORVASC) 5 MG tablet Take 2.5 mg by mouth as needed.    [provider]  amoxicillin-clavulanate (AUGMENTIN) 875-125 MG tablet Take 1 tablet by mouth every 12 (twelve) hours. 10/25/22   Radford Pax, NP  carbidopa-levodopa (SINEMET IR) 25-250 MG tablet Take 2 tablets by mouth 4 (four) times daily. Pt a uses 25/200    [provider]  ezetimibe (ZETIA) 10 MG tablet Take by mouth. 07/19/16   [provider]  meclizine (ANTIVERT) 12.5 MG tablet Take 1 tablet (12.5 mg total) by mouth every 8 (eight) hours as needed for dizziness. 04/20/21   Amyot,  Ali Lowe, NP  mirabegron ER (MYRBETRIQ) 25 MG TB24 tablet Take 1 tablet (25 mg total) by mouth daily. 06/29/21   Sondra Come, MD  mirtazapine (REMERON) 7.5 MG tablet Take 7.5 mg by mouth at bedtime. 05/17/21   [provider]  omeprazole (PRILOSEC) 20 MG capsule TAKE 1 CAPSULE DAILY 05/24/16   [provider]  Vitamin D, Ergocalciferol, (DRISDOL) 50000 units CAPS capsule TAKE 1 CAPSULE ONCE A WEEK 03/08/16   [provider]    Family History Family History  Problem Relation Age of Onset   Breast cancer Mother 40   Kidney  cancer Neg Hx    Bladder Cancer Neg Hx     Social History Social History   Tobacco Use   Smoking status: Never   Smokeless tobacco: Never  Vaping Use   Vaping status: Never Used  Substance Use Topics   Alcohol use: Not Currently   Drug use: Never     Allergies   Tramadol, Nitrofuran derivatives, Simvastatin, Ultram [tramadol hcl], Adhesive [tape], and Wound dressing adhesive   Review of Systems Review of Systems  Musculoskeletal:        Right side/rib pain     Physical Exam Triage Vital Signs ED Triage Vitals  Encounter Vitals Group     BP 02/16/23 1418 (!) 145/81     Systolic BP Percentile --      Diastolic BP Percentile --      Pulse Rate 02/16/23 1418 74     Resp 02/16/23 1418 16     Temp 02/16/23 1418 97.7 F (36.5 C)     Temp Source 02/16/23 1418 Oral     SpO2 02/16/23 1418 99 %     Weight --      Height --      Head Circumference --      Peak Flow --      Pain Score 02/16/23 1416 4     Pain Loc --      Pain Education --      Exclude from Growth Chart --    No data found.  Updated Vital Signs BP (!) 145/81 (BP Location: Right Arm)   Pulse 74   Temp 97.7 F (36.5 C) (Oral)   Resp 16   LMP  (LMP Unknown)   SpO2 99%   Visual Acuity Right Eye Distance:   Left Eye Distance:   Bilateral Distance:    Right Eye Near:   Left Eye Near:    Bilateral Near:     Physical Exam Vitals and nursing note reviewed.  Constitutional:      General: She is not in acute distress.    Appearance: Normal appearance. She is not ill-appearing.  HENT:     Head: Normocephalic and atraumatic.  Eyes:     Pupils: Pupils are equal, round, and reactive to light.  Cardiovascular:     Rate and Rhythm: Normal rate and regular rhythm.  Pulmonary:     Effort: Pulmonary effort is normal.     Breath sounds: Normal breath sounds.  Abdominal:     Tenderness: There is no right CVA tenderness or left CVA tenderness.       Comments: There is no bruising, swelling,  erythema of the right side/ribs/abdomen.  Some mild tenderness with palpation to the lateral lower ribs that extends slightly to anterior.  No deformity or step-off.  No tenderness to right upper quadrant.  Equal chest expansion bilaterally.  Skin:    General: Skin is  warm and dry.  Neurological:     General: No focal deficit present.     Mental Status: She is alert and oriented to person, place, and time.  Psychiatric:        Mood and Affect: Mood normal.        Behavior: Behavior normal.      UC Treatments / Results  Labs (all labs ordered are listed, but only abnormal results are displayed) Labs Reviewed - No data to display  EKG   Radiology No results found.  Procedures Procedures (including critical care time)  Medications Ordered in UC Medications - No data to display  Initial Impression / Assessment and Plan / UC Course  I have reviewed the triage vital signs and the nursing notes.  Pertinent labs & imaging results that were available during my care of the patient were reviewed by me and considered in my medical decision making (see chart for details).     Reviewed exam and symptoms with patient.  No red flags.  Will defer UA as patient is on antibiotics for UTI and has no flank pain, fevers.  Wet read of x-ray shows no fracture.  Will contact for any abnormalities with radiology overread once available.  Discussed likely musculoskeletal versus gas pain.  Since patient has been in clinic she has had no pain.  Advised Tylenol and warm compresses as needed.  Maintain hydration.  Advised to follow-up with PCP 2 days for recheck.  Strict ER precautions reviewed and patient and daughter verbalized understanding. Final Clinical Impressions(s) / UC Diagnoses   Final diagnoses:  Rib pain on right side     Discharge Instructions      You may take Tylenol as needed.  Please use warm compresses to the site as needed.  Follow-up with your PCP in 2 days for recheck.  Please  go to the emergency room if you develop any worsening symptoms.  I hope you feel better soon!     ED Prescriptions   None    PDMP not reviewed this encounter.   Radford Pax, NP 02/16/23 [phone]

## 2023-02-16 NOTE — Discharge Instructions (Signed)
You may take Tylenol as needed.  Please use warm compresses to the site as needed.  Follow-up with your PCP in 2 days for recheck.  Please go to the emergency room if you develop any worsening symptoms.  I hope you feel better soon!

## 2023-02-16 NOTE — ED Triage Notes (Signed)
Right side pain that started today.

## 2023-02-21 ENCOUNTER — Ambulatory Visit: Payer: Medicare PPO

## 2023-07-21 ENCOUNTER — Ambulatory Visit: Admission: EM | Admit: 2023-07-21 | Discharge: 2023-07-21 | Disposition: A | Discharge status: Home / Self Care

## 2023-07-21 DIAGNOSIS — S0990XA Unspecified injury of head, initial encounter: Secondary | ICD-10-CM

## 2023-07-21 DIAGNOSIS — R109 Unspecified abdominal pain: Secondary | ICD-10-CM

## 2023-07-21 DIAGNOSIS — R1012 Left upper quadrant pain: Secondary | ICD-10-CM

## 2023-07-21 DIAGNOSIS — R296 Repeated falls: Secondary | ICD-10-CM

## 2023-07-21 NOTE — ED Triage Notes (Signed)
 Daughter states that patient was having left side flank pain that started 2 days ago. Daughter states that pain got worse when patient fell about a hour ago.

## 2023-07-21 NOTE — Discharge Instructions (Signed)
 Go to ER for further evaluation .

## 2023-07-21 NOTE — ED Notes (Signed)
 Patient is being discharged from the Urgent Care and sent to the [facility] Emergency Department via private vehicle with family member . Per Clancy Gourd, NP, patient is in need of higher level of care due to unwitnessed fall and head injury. Patient is aware and verbalizes understanding of plan of care.  Vitals:   07/21/23 1853  BP: 138/81  Pulse: 77  Resp: 14  Temp: 98.4 F (36.9 C)  SpO2: 95%

## 2023-07-21 NOTE — ED Provider Notes (Signed)
 [facility]    CSN: [id number] Arrival date & time: 07/21/23  1827      History   Chief Complaint Chief Complaint  Patient presents with   Flank Pain   Fall    HPI Julie Lynn is a 83 y.o. female.   83 year old female, Julie Lynn, presents to urgent care for evaluation of worsening left side/ flank pain/abdominal pain that started 2 days ago"8/10", patient had unwitnessed fall today~1700 striking head per patient report, patient denies LOC however is confused normally per daughter(Parkinson's and dementia).  Patient reports worsening pain with movement and breathing, patient points to left lateral ribs and left upper abdominal area.  Patient took meloxicam at 2 PM today prior to fall at 5 PM per daughter.  Patient is leaning to the right(guarding side), obeys commands.  Past medical history: Parkinson's, psychosis, dementia, mood disorder, anxiety, depression, skin cancer, vertigo, hepatitis A, scoliosis  The history is provided by the patient. No language interpreter was used.    Past Medical History:  Diagnosis Date   Anxiety    Cancer (HCC) 05/2017   skin   Depression    GERD (gastroesophageal reflux disease)    Heartburn    Hepatitis A 1962   HLD (hyperlipidemia)    HTN (hypertension)    Hypertension    Parkinson disease (HCC)    Scoliosis    Vertigo    rare    Patient Active Problem List   Diagnosis Date Noted   Unwitnessed fall 07/21/2023   Head injury 07/21/2023   Left flank pain 07/21/2023   Left upper quadrant abdominal pain 07/21/2023    Past Surgical History:  Procedure Laterality Date   ABDOMINAL HYSTERECTOMY  1977   ABDOMINAL HYSTERECTOMY     CATARACT EXTRACTION W/PHACO Left 11/01/2017   Procedure: CATARACT EXTRACTION PHACO AND INTRAOCULAR LENS PLACEMENT (IOC) LEFT;  Surgeon: Lockie Mola, MD;  Location: [facility];  Service: Ophthalmology;  Laterality: Left;   CATARACT EXTRACTION W/PHACO Right 12/12/2018   Procedure:  CATARACT EXTRACTION PHACO AND INTRAOCULAR LENS PLACEMENT (IOC)  RIGHT;  Surgeon: Lockie Mola, MD;  Location: [facility];  Service: Ophthalmology;  Laterality: Right;   COLONOSCOPY WITH PROPOFOL N/A 06/15/2018   Procedure: COLONOSCOPY WITH PROPOFOL;  Surgeon: Christena Deem, MD;  Location: [facility];  Service: Endoscopy;  Laterality: N/A;   ESOPHAGOGASTRODUODENOSCOPY (EGD) WITH PROPOFOL N/A 06/15/2018   Procedure: ESOPHAGOGASTRODUODENOSCOPY (EGD) WITH PROPOFOL;  Surgeon: Christena Deem, MD;  Location: [facility];  Service: Endoscopy;  Laterality: N/A;    OB History   No obstetric history on file.      Home Medications    Prior to Admission medications   Medication Sig Start Date End Date Taking? Authorizing Provider  amLODipine (NORVASC) 5 MG tablet Take 2.5 mg by mouth as needed.   Yes [provider]  carbidopa-levodopa (SINEMET IR) 25-250 MG tablet Take 2 tablets by mouth 4 (four) times daily. Pt a uses 25/200   Yes [provider]  ezetimibe (ZETIA) 10 MG tablet Take by mouth. 07/19/16  Yes [provider]  mirabegron ER (MYRBETRIQ) 25 MG TB24 tablet Take 1 tablet (25 mg total) by mouth daily. 06/29/21  Yes Sondra Come, MD  mirtazapine (REMERON) 7.5 MG tablet Take 7.5 mg by mouth at bedtime. 05/17/21  Yes [provider]  omeprazole (PRILOSEC) 20 MG capsule TAKE 1 CAPSULE DAILY 05/24/16  Yes [provider]  amoxicillin-clavulanate (AUGMENTIN) 875-125 MG tablet Take 1 tablet  by mouth every 12 (twelve) hours. 10/25/22   Radford Pax, NP  meclizine (ANTIVERT) 12.5 MG tablet Take 1 tablet (12.5 mg total) by mouth every 8 (eight) hours as needed for dizziness. 04/20/21   Sudie Grumbling, NP  Vitamin D, Ergocalciferol, (DRISDOL) 50000 units CAPS capsule TAKE 1 CAPSULE ONCE A WEEK 03/08/16   [provider]    Family History Family History  Problem Relation Age of Onset   Breast cancer Mother 83    Kidney cancer Neg Hx    Bladder Cancer Neg Hx     Social History Social History   Tobacco Use   Smoking status: Never   Smokeless tobacco: Never  Vaping Use   Vaping status: Never Used  Substance Use Topics   Alcohol use: Not Currently   Drug use: Never     Allergies   Tramadol, Nitrofuran derivatives, Simvastatin, Ultram [tramadol hcl], Adhesive [tape], and Wound dressing adhesive   Review of Systems Review of Systems  Constitutional:  Negative for fever.  Gastrointestinal:  Positive for abdominal pain. Negative for nausea and vomiting.  Musculoskeletal:  Positive for myalgias.  Skin: Negative.   Neurological:        "Confused"  All other systems reviewed and are negative.    Physical Exam Triage Vital Signs ED Triage Vitals  Encounter Vitals Group     BP 07/21/23 1853 138/81     Systolic BP Percentile --      Diastolic BP Percentile --      Pulse Rate 07/21/23 1853 77     Resp 07/21/23 1853 14     Temp 07/21/23 1853 98.4 F (36.9 C)     Temp Source 07/21/23 1853 Oral     SpO2 07/21/23 1853 95 %     Weight --      Height --      Head Circumference --      Peak Flow --      Pain Score 07/21/23 1851 8     Pain Loc --      Pain Education --      Exclude from Growth Chart --    No data found.  Updated Vital Signs BP 138/81 (BP Location: Right Arm)   Pulse 77   Temp 98.4 F (36.9 C) (Oral)   Resp 14   LMP  (LMP Unknown)   SpO2 95%   Visual Acuity Right Eye Distance:   Left Eye Distance:   Bilateral Distance:    Right Eye Near:   Left Eye Near:    Bilateral Near:     Physical Exam Vitals and nursing note reviewed.  Constitutional:      Appearance: She is well-developed and well-groomed.  HENT:     Head: Normocephalic.  Eyes:     Pupils: Pupils are equal, round, and reactive to light.  Cardiovascular:     Rate and Rhythm: Normal rate and regular rhythm.     Heart sounds: Normal heart sounds.  Pulmonary:     Effort: Pulmonary effort is  normal.     Breath sounds: Normal breath sounds and air entry.  Chest:     Chest wall: Tenderness present.    Skin:    General: Skin is warm.     Findings: No ecchymosis or rash.  Neurological:     Mental Status: She is alert.     GCS: GCS eye subscore is 4. GCS verbal subscore is 4. GCS motor subscore is 6.  Psychiatric:  Attention and Perception: Attention normal.        Mood and Affect: Mood normal.        Speech: Speech normal.        Behavior: Behavior is cooperative.     Comments: "Confused" normal per daughter      UC Treatments / Results  Labs (all labs ordered are listed, but only abnormal results are displayed) Labs Reviewed - No data to display  EKG   Radiology No results found.  Procedures Procedures (including critical care time)  Medications Ordered in UC Medications - No data to display  Initial Impression / Assessment and Plan / UC Course  I have reviewed the triage vital signs and the nursing notes.  Pertinent labs & imaging results that were available during my care of the patient were reviewed by me and considered in my medical decision making (see chart for details).    Discussed exam findings and plan of care with patient and daughter, recommend further evaluation in the emergency room given hx and MOI.  Daughter verbalized understanding to this provider, will go to [facility] for further evaluation.  Ddx: Unwitnessed fall, injury of head, left flank pain, LUQ pain Final Clinical Impressions(s) / UC Diagnoses   Final diagnoses:  Injury of head, initial encounter  Unwitnessed fall  Left flank pain  Left upper quadrant abdominal pain     Discharge Instructions      Go to ER for further evaluation    ED Prescriptions   None    PDMP not reviewed this encounter.   Clancy Gourd, NP 07/21/23 1949

## 2023-07-22 ENCOUNTER — Emergency Department

## 2023-07-22 ENCOUNTER — Other Ambulatory Visit: Payer: Self-pay

## 2023-07-22 ENCOUNTER — Emergency Department
Admission: EM | Admit: 2023-07-22 | Discharge: 2023-07-22 | Disposition: A | Discharge status: Home / Self Care | Attending: Emergency Medicine | Admitting: Emergency Medicine

## 2023-07-22 DIAGNOSIS — G20A1 Parkinson's disease without dyskinesia, without mention of fluctuations: Secondary | ICD-10-CM | POA: Insufficient documentation

## 2023-07-22 DIAGNOSIS — I10 Essential (primary) hypertension: Secondary | ICD-10-CM | POA: Diagnosis not present

## 2023-07-22 DIAGNOSIS — S7002XA Contusion of left hip, initial encounter: Secondary | ICD-10-CM | POA: Diagnosis not present

## 2023-07-22 DIAGNOSIS — S7012XA Contusion of left thigh, initial encounter: Secondary | ICD-10-CM | POA: Insufficient documentation

## 2023-07-22 DIAGNOSIS — W01198A Fall on same level from slipping, tripping and stumbling with subsequent striking against other object, initial encounter: Secondary | ICD-10-CM | POA: Diagnosis not present

## 2023-07-22 DIAGNOSIS — S20212A Contusion of left front wall of thorax, initial encounter: Secondary | ICD-10-CM | POA: Insufficient documentation

## 2023-07-22 DIAGNOSIS — Y92009 Unspecified place in unspecified non-institutional (private) residence as the place of occurrence of the external cause: Secondary | ICD-10-CM | POA: Insufficient documentation

## 2023-07-22 DIAGNOSIS — F0390 Unspecified dementia without behavioral disturbance: Secondary | ICD-10-CM | POA: Insufficient documentation

## 2023-07-22 DIAGNOSIS — R079 Chest pain, unspecified: Secondary | ICD-10-CM | POA: Diagnosis present

## 2023-07-22 LAB — URINALYSIS, W/ REFLEX TO CULTURE (INFECTION SUSPECTED)
Bacteria, UA: NONE SEEN
Bilirubin Urine: NEGATIVE
Glucose, UA: NEGATIVE mg/dL
Hgb urine dipstick: NEGATIVE
Ketones, ur: NEGATIVE mg/dL
Leukocytes,Ua: NEGATIVE
Nitrite: NEGATIVE
Protein, ur: NEGATIVE mg/dL
RBC / HPF: 0 RBC/hpf (ref 0–5)
Specific Gravity, Urine: 1.002 — ABNORMAL LOW (ref 1.005–1.030)
Squamous Epithelial / HPF: 0 /HPF (ref 0–5)
WBC, UA: 0 WBC/hpf (ref 0–5)
pH: 7 (ref 5.0–8.0)

## 2023-07-22 LAB — CBC
HCT: 40.7 % (ref 36.0–46.0)
Hemoglobin: 13.8 g/dL (ref 12.0–15.0)
MCH: 31.7 pg (ref 26.0–34.0)
MCHC: 33.9 g/dL (ref 30.0–36.0)
MCV: 93.6 fL (ref 80.0–100.0)
Platelets: 205 10*3/uL (ref 150–400)
RBC: 4.35 MIL/uL (ref 3.87–5.11)
RDW: 12.4 % (ref 11.5–15.5)
WBC: 5.6 10*3/uL (ref 4.0–10.5)
nRBC: 0 % (ref 0.0–0.2)

## 2023-07-22 LAB — BASIC METABOLIC PANEL
Anion gap: 8 (ref 5–15)
BUN: 33 mg/dL — ABNORMAL HIGH (ref 8–23)
CO2: 25 mmol/L (ref 22–32)
Calcium: 9.5 mg/dL (ref 8.9–10.3)
Chloride: 105 mmol/L (ref 98–111)
Creatinine, Ser: 1.13 mg/dL — ABNORMAL HIGH (ref 0.44–1.00)
GFR, Estimated: 48 mL/min — ABNORMAL LOW (ref >60)
Glucose, Bld: 103 mg/dL — ABNORMAL HIGH (ref 70–99)
Potassium: 4 mmol/L (ref 3.5–5.1)
Sodium: 138 mmol/L (ref 135–145)

## 2023-07-22 LAB — TROPONIN I (HIGH SENSITIVITY): Troponin I (High Sensitivity): 4 ng/L (ref <18)

## 2023-07-22 MED ORDER — SODIUM CHLORIDE 0.9 % IV BOLUS
1000.0000 mL | Freq: Once | INTRAVENOUS | Status: AC
  Administered 2023-07-22: 1000 mL via INTRAVENOUS

## 2023-07-22 MED ORDER — ACETAMINOPHEN 500 MG PO TABS
1000.0000 mg | ORAL_TABLET | Freq: Once | ORAL | Status: AC
  Administered 2023-07-22: 1000 mg via ORAL
  Filled 2023-07-22: qty 2

## 2023-07-22 NOTE — Discharge Instructions (Signed)
 Your urine test is normal today. Your other tests were reassuring as well.

## 2023-07-22 NOTE — ED Notes (Signed)
 Pt given a warm blanket by this RN and headed to CT!

## 2023-07-22 NOTE — ED Provider Notes (Signed)
 [facility] Provider Note    Event Date/Time   First MD Initiated Contact with Patient 07/22/23 1511     (approximate)   History   Chief Complaint: Chest Pain   HPI  Julie Lynn is a 83 y.o. female with a history of Parkinson's disease, dementia, GERD, hypertension who is brought to the ED today due to left side pain after a fall.  Patient fell 4 days ago and also yesterday.  She reports that she did hit her head, but denies any headache or neck pain or loss of consciousness.  No chest pain shortness of breath vomiting or dysuria.  Daughter reports that the patient eats well but often struggles to drink adequate fluids.          Physical Exam   Triage Vital Signs: ED Triage Vitals  Encounter Vitals Group     BP 07/22/23 1503 (!) 83/67     Systolic BP Percentile --      Diastolic BP Percentile --      Pulse Rate 07/22/23 1501 84     Resp 07/22/23 1501 17     Temp 07/22/23 1501 98 F (36.7 C)     Temp Source 07/22/23 1501 Oral     SpO2 07/22/23 1501 97 %     Weight --      Height --      Head Circumference --      Peak Flow --      Pain Score --      Pain Loc --      Pain Education --      Exclude from Growth Chart --     Most recent vital signs: Vitals:   07/22/23 1523 07/22/23 1800  BP:  138/74  Pulse:  69  Resp:  14  Temp:    SpO2: 100% 100%    General: Awake, no distress.  CV:  Good peripheral perfusion.  Regular rate and rhythm Resp:  Normal effort.  Clear to auscultation bilaterally Abd:  No distention.  Soft nontender Other:  There is tenderness along the left lateral inferior chest wall over the false ribs reproducing some of her pain.  Additionally there is some tenderness at the left hip in the area of the femoral neck.  Left hip pain is also provoked with passive hip flexion.   ED Results / Procedures / Treatments   Labs (all labs ordered are listed, but only abnormal results are displayed) Labs Reviewed  BASIC  METABOLIC PANEL - Abnormal; Notable for the following components:      Result Value   Glucose, Bld 103 (*)    BUN 33 (*)    Creatinine, Ser 1.13 (*)    GFR, Estimated 48 (*)    All other components within normal limits  URINALYSIS, W/ REFLEX TO CULTURE (INFECTION SUSPECTED) - Abnormal; Notable for the following components:   Color, Urine COLORLESS (*)    APPearance CLEAR (*)    Specific Gravity, Urine 1.002 (*)    All other components within normal limits  CBC  TROPONIN I (HIGH SENSITIVITY)     EKG Interpreted by me Sinus rhythm rate of 84.  Right axis, right bundle branch block, no acute ischemic changes.  2 PVCs on the strip.   RADIOLOGY CT head interpreted by me, negative for intracranial hemorrhage.  Radiology report reviewed   PROCEDURES:  Procedures   MEDICATIONS ORDERED IN ED: Medications  sodium chloride 0.9 % bolus 1,000 mL (0 mLs  Intravenous Stopped 07/22/23 1841)  acetaminophen (TYLENOL) tablet 1,000 mg (1,000 mg Oral Given 07/22/23 1531)     IMPRESSION / MDM / ASSESSMENT AND PLAN / ED COURSE  I reviewed the triage vital signs and the nursing notes.  DDx: Intracranial hemorrhage, rib fracture, pneumothorax, hip fracture, chest wall contusion, electrolyte derangement, anemia, UTI, dehydration, Parkinson's disease with gait instability  Patient's presentation is most consistent with acute presentation with potential threat to life or bodily function.  Patient presents after falls at home.  Exam concerning for 2 areas of pain and tenderness at the left hip and the left chest wall.  No chest wall instability, no obvious pneumothorax on exam.  No evidence of head trauma, no spinal tenderness.  Will obtain CT head along with x-rays and labs.  Will give IV fluids for hydration.   ----------------------------------------- 6:50 PM on 07/22/2023 ----------------------------------------- Workup all reassuring.  Urinalysis is normal after recent Macrobid course.   Stable for discharge continue supportive care with Tylenol.      FINAL CLINICAL IMPRESSION(S) / ED DIAGNOSES   Final diagnoses:  Chest wall contusion, left, initial encounter  Contusion of left hip and thigh, initial encounter  Parkinson's disease, unspecified whether dyskinesia present, unspecified whether manifestations fluctuate (HCC)     Rx / DC Orders   ED Discharge Orders     None        Note:  This document was prepared using Dragon voice recognition software and may include unintentional dictation errors.   Sharman Cheek, MD 07/22/23 1850

## 2023-07-22 NOTE — ED Triage Notes (Signed)
 Pt c/o left upper side/rib pain, dizziness, and possible fall x3-4 days per daughter. Pt has lidocaine patch over affected side. Pt is alert, oriented to self and situation only.

## 2023-08-10 ENCOUNTER — Encounter: Payer: Self-pay | Admitting: Emergency Medicine

## 2023-08-10 ENCOUNTER — Emergency Department

## 2023-08-10 ENCOUNTER — Other Ambulatory Visit: Payer: Self-pay

## 2023-08-10 ENCOUNTER — Emergency Department
Admission: EM | Admit: 2023-08-10 | Discharge: 2023-08-10 | Disposition: A | Discharge status: Home / Self Care | Attending: Emergency Medicine | Admitting: Emergency Medicine

## 2023-08-10 DIAGNOSIS — W19XXXA Unspecified fall, initial encounter: Secondary | ICD-10-CM

## 2023-08-10 DIAGNOSIS — W01198A Fall on same level from slipping, tripping and stumbling with subsequent striking against other object, initial encounter: Secondary | ICD-10-CM | POA: Diagnosis not present

## 2023-08-10 DIAGNOSIS — N390 Urinary tract infection, site not specified: Secondary | ICD-10-CM

## 2023-08-10 DIAGNOSIS — S0083XA Contusion of other part of head, initial encounter: Secondary | ICD-10-CM | POA: Insufficient documentation

## 2023-08-10 DIAGNOSIS — M542 Cervicalgia: Secondary | ICD-10-CM | POA: Diagnosis not present

## 2023-08-10 DIAGNOSIS — G20C Parkinsonism, unspecified: Secondary | ICD-10-CM | POA: Insufficient documentation

## 2023-08-10 DIAGNOSIS — R519 Headache, unspecified: Secondary | ICD-10-CM | POA: Diagnosis present

## 2023-08-10 LAB — URINALYSIS, ROUTINE W REFLEX MICROSCOPIC
Bacteria, UA: NONE SEEN
Bilirubin Urine: NEGATIVE
Glucose, UA: NEGATIVE mg/dL
Hgb urine dipstick: NEGATIVE
Ketones, ur: 5 mg/dL — AB
Nitrite: NEGATIVE
Protein, ur: NEGATIVE mg/dL
Specific Gravity, Urine: 1.017 (ref 1.005–1.030)
pH: 5 (ref 5.0–8.0)

## 2023-08-10 MED ORDER — CEPHALEXIN 250 MG PO CAPS
250.0000 mg | ORAL_CAPSULE | Freq: Once | ORAL | Status: AC
  Administered 2023-08-10: 250 mg via ORAL
  Filled 2023-08-10: qty 1

## 2023-08-10 MED ORDER — CEPHALEXIN 250 MG PO CAPS: 250.0000 mg | ORAL_CAPSULE | Freq: Three times a day (TID) | ORAL | 0 refills | Status: AC

## 2023-08-10 NOTE — ED Provider Notes (Signed)
 [facility] Provider Note    Event Date/Time   First MD Initiated Contact with Patient 08/10/23 1937     (approximate)   History   Fall   HPI  SATIA WINGER is a 83 y.o. female who presents to the emergency department today after a fall and concern for head injury.  Patient has a history of Parkinson's.  States that she tripped because of this.  Did hit her head.  No loss of consciousness.  Patient did have some head and neck pain although states it is better at the time my exam.  Patient is also noticing increased urinary urgency recently.  No dysuria.     Physical Exam   Triage Vital Signs: ED Triage Vitals  Encounter Vitals Group     BP 08/10/23 1952 (!) 164/92     Systolic BP Percentile --      Diastolic BP Percentile --      Pulse Rate 08/10/23 1952 77     Resp 08/10/23 1952 20     Temp 08/10/23 1952 (!) 97.4 F (36.3 C)     Temp Source 08/10/23 1952 Oral     SpO2 08/10/23 1952 99 %     Weight 08/10/23 1954 120 lb (54.4 kg)     Height 08/10/23 1954 5\' 2"  (1.575 m)     Head Circumference --      Peak Flow --      Pain Score 08/10/23 1953 2     Pain Loc --      Pain Education --      Exclude from Growth Chart --     Most recent vital signs: Vitals:   08/10/23 1952  BP: (!) 164/92  Pulse: 77  Resp: 20  Temp: (!) 97.4 F (36.3 C)  SpO2: 99%   General: Awake, alert, oriented. CV:  Good peripheral perfusion. Regular rate and rhythm. Resp:  Normal effort. Lungs clear. Abd:  No distention.  Other:  Hematoma to forehead.    ED Results / Procedures / Treatments   Labs (all labs ordered are listed, but only abnormal results are displayed) Labs Reviewed  URINALYSIS, ROUTINE W REFLEX MICROSCOPIC - Abnormal; Notable for the following components:      Result Value   Color, Urine YELLOW (*)    APPearance CLEAR (*)    Ketones, ur 5 (*)    Leukocytes,Ua SMALL (*)    All other components within normal limits      EKG  None   RADIOLOGY I independently interpreted and visualized the left knee. My interpretation: no acute osseous abnormality Radiology interpretation:  IMPRESSION:  No evidence of fracture or dislocation. Mild osteopenia.   I independently interpreted and visualized the ct head/cervical spine. My interpretation: no ICH, no fracture Radiology interpretation:  IMPRESSION:  1. No acute intracranial abnormality.  2. No acute fracture in the cervical spine.  3. Frothy mucous in the sphenoid sinuses.  Correlate for sinusitis.      PROCEDURES:  Critical Care performed: No   MEDICATIONS ORDERED IN ED: Medications - No data to display   IMPRESSION / MDM / ASSESSMENT AND PLAN / ED COURSE  I reviewed the triage vital signs and the nursing notes.                              Differential diagnosis includes, but is not limited to, ICH, fracture, UTI, contusion  Patient's presentation is  most consistent with acute presentation with potential threat to life or bodily function.  Patient presented to the emergency department today because of concerns for head injury after a fall.  On exam patient does have hematoma to her forehead.  Also have concern for increased urinary urgency.  CT head and cervical spine without concerning findings.  Right knee without concerning abnormality.  Will check UA.  UA concerning for UTI. Will give first dose of antibiotics here. Will discharge with prescription for further antibiotics.      FINAL CLINICAL IMPRESSION(S) / ED DIAGNOSES   Final diagnoses:  Fall, initial encounter  Lower urinary tract infectious disease      Note:  This document was prepared using Dragon voice recognition software and may include unintentional dictation errors.    Phineas Semen, MD 08/10/23 2204

## 2023-08-10 NOTE — ED Notes (Signed)
 Ice pack provided for comfort.

## 2023-08-10 NOTE — ED Notes (Signed)
 Departure condition charted on the incorrect patient.  Please disregard.

## 2023-08-10 NOTE — ED Notes (Signed)
 Patient was incontinent just before arriving to the bathroom to provide a urine sample.   Will attempt to get sample again in a bit.

## 2023-08-10 NOTE — ED Triage Notes (Signed)
 Unwitnessed mechanical fall that patient remembers. She states she tripped over her feet while she was walking. - LOC -Thinners. Patient is A & O X 3 answering questions appropriately. She complains of a dull headache, and a medium sized hematoma to the L forehead.

## 2023-08-21 ENCOUNTER — Other Ambulatory Visit: Payer: Self-pay

## 2023-08-21 ENCOUNTER — Emergency Department
Admission: EM | Admit: 2023-08-21 | Discharge: 2023-08-21 | Disposition: A | Discharge status: Home / Self Care | Attending: Emergency Medicine | Admitting: Emergency Medicine

## 2023-08-21 ENCOUNTER — Emergency Department

## 2023-08-21 DIAGNOSIS — F0390 Unspecified dementia without behavioral disturbance: Secondary | ICD-10-CM | POA: Insufficient documentation

## 2023-08-21 DIAGNOSIS — W1839XA Other fall on same level, initial encounter: Secondary | ICD-10-CM | POA: Insufficient documentation

## 2023-08-21 DIAGNOSIS — I10 Essential (primary) hypertension: Secondary | ICD-10-CM | POA: Insufficient documentation

## 2023-08-21 DIAGNOSIS — S0990XA Unspecified injury of head, initial encounter: Secondary | ICD-10-CM | POA: Diagnosis present

## 2023-08-21 DIAGNOSIS — S0083XA Contusion of other part of head, initial encounter: Secondary | ICD-10-CM | POA: Insufficient documentation

## 2023-08-21 DIAGNOSIS — G20C Parkinsonism, unspecified: Secondary | ICD-10-CM | POA: Diagnosis not present

## 2023-08-21 DIAGNOSIS — W19XXXA Unspecified fall, initial encounter: Secondary | ICD-10-CM

## 2023-08-21 LAB — CBC
HCT: 42.2 % (ref 36.0–46.0)
Hemoglobin: 14.2 g/dL (ref 12.0–15.0)
MCH: 31.4 pg (ref 26.0–34.0)
MCHC: 33.6 g/dL (ref 30.0–36.0)
MCV: 93.4 fL (ref 80.0–100.0)
Platelets: 202 10*3/uL (ref 150–400)
RBC: 4.52 MIL/uL (ref 3.87–5.11)
RDW: 13 % (ref 11.5–15.5)
WBC: 7.1 10*3/uL (ref 4.0–10.5)
nRBC: 0 % (ref 0.0–0.2)

## 2023-08-21 LAB — URINALYSIS, W/ REFLEX TO CULTURE (INFECTION SUSPECTED)
Bilirubin Urine: NEGATIVE
Glucose, UA: NEGATIVE mg/dL
Hgb urine dipstick: NEGATIVE
Ketones, ur: NEGATIVE mg/dL
Leukocytes,Ua: NEGATIVE
Nitrite: NEGATIVE
Protein, ur: NEGATIVE mg/dL
Specific Gravity, Urine: 1.023 (ref 1.005–1.030)
pH: 5 (ref 5.0–8.0)

## 2023-08-21 LAB — BASIC METABOLIC PANEL WITH GFR
Anion gap: 10 (ref 5–15)
BUN: 24 mg/dL — ABNORMAL HIGH (ref 8–23)
CO2: 25 mmol/L (ref 22–32)
Calcium: 9.4 mg/dL (ref 8.9–10.3)
Chloride: 106 mmol/L (ref 98–111)
Creatinine, Ser: 0.87 mg/dL (ref 0.44–1.00)
GFR, Estimated: >60 mL/min (ref >60)
Glucose, Bld: 85 mg/dL (ref 70–99)
Potassium: 3.7 mmol/L (ref 3.5–5.1)
Sodium: 141 mmol/L (ref 135–145)

## 2023-08-21 LAB — TROPONIN I (HIGH SENSITIVITY): Troponin I (High Sensitivity): 9 ng/L (ref <18)

## 2023-08-21 MED ORDER — ACETAMINOPHEN 325 MG PO TABS
650.0000 mg | ORAL_TABLET | Freq: Once | ORAL | Status: AC
  Administered 2023-08-21: 650 mg via ORAL
  Filled 2023-08-21: qty 2

## 2023-08-21 MED ORDER — LIDOCAINE HCL (PF) 1 % IJ SOLN
5.0000 mL | Freq: Once | INTRAMUSCULAR | Status: AC
  Administered 2023-08-21: 5 mL
  Filled 2023-08-21: qty 5

## 2023-08-21 NOTE — Discharge Instructions (Signed)
 You no findings of bleeding in your brain.  You had a CT scan done today.  No findings of urinary tract infection and lab work was normal.  You had 1 staple placed in your scalp wound.  You need to have it removed in 1 week.  You can take meloxicam and Tylenol for pain control.

## 2023-08-21 NOTE — ED Provider Notes (Addendum)
 [facility] Provider Note    Event Date/Time   First MD Initiated Contact with Patient 08/21/23 1150     (approximate)   History   Fall   HPI  Julie Lynn is a 83 y.o. female past medical history significant for Parkinson's disease, dementia, GERD, hypertension, who presents to the emergency department after a fall.  Patient on a fall today when getting out of bed.  Recently treated for urinary tract infection.  Told the daughter this weekend that she was having some ongoing problems with urination.  Fall today.  Daughter states that she is not on any anticoagulation and is at her mental status baseline.  Took Tylenol this morning.  Denies any chest pain or shortness of breath.  Has an appointment with her primary care physician this afternoon.  On chart review 2 recent evaluations in the emergency department for a fall.  Recently treated with Macrobid      Physical Exam   Triage Vital Signs: ED Triage Vitals  Encounter Vitals Group     BP 08/21/23 1029 (!) 186/88     Systolic BP Percentile --      Diastolic BP Percentile --      Pulse Rate 08/21/23 1028 82     Resp 08/21/23 1028 17     Temp 08/21/23 1028 97.7 F (36.5 C)     Temp Source 08/21/23 1028 Oral     SpO2 08/21/23 1028 97 %     Weight 08/21/23 1029 121 lb (54.9 kg)     Height 08/21/23 1029 5' (1.524 m)     Head Circumference --      Peak Flow --      Pain Score 08/21/23 1032 4     Pain Loc --      Pain Education --      Exclude from Growth Chart --     Most recent vital signs: Vitals:   08/21/23 1029 08/21/23 1352  BP: (!) 186/88 (!) 168/88  Pulse:  88  Resp:  18  Temp:    SpO2:  100%    Physical Exam Constitutional:      Appearance: She is well-developed.  HENT:     Head:     Comments: Dried blood with obvious trauma to the right side of the head.  Prior trauma with hematoma and ecchymosis to the left forehead.  Ocular movements are intact. Eyes:      Conjunctiva/sclera: Conjunctivae normal.  Cardiovascular:     Rate and Rhythm: Regular rhythm. Tachycardia present.  Pulmonary:     Effort: No respiratory distress.  Abdominal:     General: There is no distension.  Musculoskeletal:        General: Normal range of motion.     Cervical back: Normal range of motion and neck supple. No tenderness.  Skin:    General: Skin is warm.     Capillary Refill: Capillary refill takes less than 2 seconds.  Neurological:     General: No focal deficit present.     Mental Status: She is alert. Mental status is at baseline.  Psychiatric:        Mood and Affect: Mood normal.     IMPRESSION / MDM / ASSESSMENT AND PLAN / ED COURSE  I reviewed the triage vital signs and the nursing notes.  Differential diagnosis including intracranial hemorrhage, urinary tract infection, dehydration, electrolyte abnormality, Parkinson's disease  Tetanus up-to-date  EKG  I, Corena Herter, the attending physician,  personally viewed and interpreted this ECG.   Rate: Normal  Rhythm: Normal sinus  Axis: Normal  Intervals: Normal  ST&T Change: None  No tachycardic or bradycardic dysrhythmias while on cardiac telemetry.  RADIOLOGY I independently reviewed imaging, my interpretation of imaging: CT scan of the head without findings of intracranial hemorrhage  LABS (all labs ordered are listed, but only abnormal results are displayed) Labs interpreted as -    Labs Reviewed  BASIC METABOLIC PANEL WITH GFR - Abnormal; Notable for the following components:      Result Value   BUN 24 (*)    All other components within normal limits  URINALYSIS, W/ REFLEX TO CULTURE (INFECTION SUSPECTED) - Abnormal; Notable for the following components:   Color, Urine YELLOW (*)    APPearance CLEAR (*)    Bacteria, UA RARE (*)    All other components within normal limits  CBC  TROPONIN I (HIGH SENSITIVITY)     MDM Given Tylenol for pain control  Lab work with no  leukocytosis.  No significant electrolyte abnormalities and appears to be at her baseline.  No findings of urinary tract infection.  CT scan of the head without signs of intracranial hemorrhage or infarction.  Discussed follow-up with primary care in 1 week for staple removal.  Discussed return precautions and follow-up.      PROCEDURES:  Critical Care performed: No  .Laceration Repair  Date/Time: 08/21/2023 2:25 PM  Performed by: Corena Herter, MD Authorized by: Corena Herter, MD   Consent:    Consent obtained:  Verbal   Consent given by:  Patient   Risks, benefits, and alternatives were discussed: yes     Risks discussed:  Pain, nerve damage, poor wound healing, poor cosmetic result, vascular damage, tendon damage, infection and need for additional repair   Alternatives discussed:  Delayed treatment and no treatment Universal protocol:    Procedure explained and questions answered to patient or proxy's satisfaction: yes     Relevant documents present and verified: yes     Test results available: yes     Imaging studies available: yes     Required blood products, implants, devices, and special equipment available: yes     Patient identity confirmed:  Verbally with patient Anesthesia:    Anesthesia method:  Local infiltration   Local anesthetic:  Lidocaine 1% w/o epi Laceration details:    Location:  Scalp   Scalp location:  Crown   Length (cm):  1 Pre-procedure details:    Preparation:  Patient was prepped and draped in usual sterile fashion Treatment:    Amount of cleaning:  Standard Skin repair:    Repair method:  Staples   Number of staples:  1 Approximation:    Approximation:  Close Repair type:    Repair type:  Simple Post-procedure details:    Dressing:  Sterile dressing   Procedure completion:  Tolerated well, no immediate complications   Patient's presentation is most consistent with acute presentation with potential threat to life or bodily  function.   MEDICATIONS ORDERED IN ED: Medications  acetaminophen (TYLENOL) tablet 650 mg (650 mg Oral Given 08/21/23 1224)  lidocaine (PF) (XYLOCAINE) 1 % injection 5 mL (5 mLs Other Given 08/21/23 1250)    FINAL CLINICAL IMPRESSION(S) / ED DIAGNOSES   Final diagnoses:  Fall, initial encounter  Injury of head, initial encounter     Rx / DC Orders   ED Discharge Orders     None  Note:  This document was prepared using Dragon voice recognition software and may include unintentional dictation errors.   Viviano Ground, MD 08/21/23 1254    Viviano Ground, MD 08/21/23 1426

## 2023-08-21 NOTE — ED Triage Notes (Signed)
 Pt here with c/o of falling out of bed and trying to avoid alarm mat, pt tried to step over mat. No LOC. Noted. Pt has HX of falls. Daughter states pt hit back of head, no blood thinner use.

## 2023-09-20 ENCOUNTER — Other Ambulatory Visit: Payer: Self-pay

## 2023-09-20 ENCOUNTER — Observation Stay
Admission: EM | Admit: 2023-09-20 | Discharge: 2023-09-21 | Disposition: A | Discharge status: Home / Self Care | Attending: Family Medicine | Admitting: Family Medicine

## 2023-09-20 ENCOUNTER — Emergency Department

## 2023-09-20 DIAGNOSIS — Z79899 Other long term (current) drug therapy: Secondary | ICD-10-CM | POA: Insufficient documentation

## 2023-09-20 DIAGNOSIS — G20C Parkinsonism, unspecified: Secondary | ICD-10-CM | POA: Insufficient documentation

## 2023-09-20 DIAGNOSIS — R296 Repeated falls: Secondary | ICD-10-CM | POA: Diagnosis not present

## 2023-09-20 DIAGNOSIS — R55 Syncope and collapse: Principal | ICD-10-CM | POA: Diagnosis present

## 2023-09-20 DIAGNOSIS — K219 Gastro-esophageal reflux disease without esophagitis: Secondary | ICD-10-CM | POA: Insufficient documentation

## 2023-09-20 DIAGNOSIS — F0280 Dementia in other diseases classified elsewhere without behavioral disturbance: Secondary | ICD-10-CM | POA: Insufficient documentation

## 2023-09-20 DIAGNOSIS — F02818 Dementia in other diseases classified elsewhere, unspecified severity, with other behavioral disturbance: Secondary | ICD-10-CM | POA: Insufficient documentation

## 2023-09-20 DIAGNOSIS — F32A Depression, unspecified: Secondary | ICD-10-CM | POA: Insufficient documentation

## 2023-09-20 DIAGNOSIS — Z85828 Personal history of other malignant neoplasm of skin: Secondary | ICD-10-CM | POA: Diagnosis not present

## 2023-09-20 DIAGNOSIS — I10 Essential (primary) hypertension: Secondary | ICD-10-CM | POA: Insufficient documentation

## 2023-09-20 DIAGNOSIS — G20A1 Parkinson's disease without dyskinesia, without mention of fluctuations: Secondary | ICD-10-CM | POA: Diagnosis not present

## 2023-09-20 DIAGNOSIS — F419 Anxiety disorder, unspecified: Secondary | ICD-10-CM | POA: Insufficient documentation

## 2023-09-20 LAB — URINALYSIS, ROUTINE W REFLEX MICROSCOPIC
Bilirubin Urine: NEGATIVE
Glucose, UA: NEGATIVE mg/dL
Hgb urine dipstick: NEGATIVE
Ketones, ur: NEGATIVE mg/dL
Leukocytes,Ua: NEGATIVE
Nitrite: NEGATIVE
Protein, ur: NEGATIVE mg/dL
Specific Gravity, Urine: 1.009 (ref 1.005–1.030)
pH: 8 (ref 5.0–8.0)

## 2023-09-20 LAB — COMPREHENSIVE METABOLIC PANEL WITH GFR
ALT: 6 U/L (ref 0–44)
AST: 25 U/L (ref 15–41)
Albumin: 3.8 g/dL (ref 3.5–5.0)
Alkaline Phosphatase: 55 U/L (ref 38–126)
Anion gap: 7 (ref 5–15)
BUN: 19 mg/dL (ref 8–23)
CO2: 26 mmol/L (ref 22–32)
Calcium: 9.7 mg/dL (ref 8.9–10.3)
Chloride: 106 mmol/L (ref 98–111)
Creatinine, Ser: 0.92 mg/dL (ref 0.44–1.00)
GFR, Estimated: >60 mL/min (ref >60)
Glucose, Bld: 136 mg/dL — ABNORMAL HIGH (ref 70–99)
Potassium: 3.8 mmol/L (ref 3.5–5.1)
Sodium: 139 mmol/L (ref 135–145)
Total Bilirubin: 1 mg/dL (ref 0.0–1.2)
Total Protein: 6.9 g/dL (ref 6.5–8.1)

## 2023-09-20 LAB — CBC
HCT: 42.2 % (ref 36.0–46.0)
Hemoglobin: 14.4 g/dL (ref 12.0–15.0)
MCH: 31.9 pg (ref 26.0–34.0)
MCHC: 34.1 g/dL (ref 30.0–36.0)
MCV: 93.4 fL (ref 80.0–100.0)
Platelets: 198 10*3/uL (ref 150–400)
RBC: 4.52 MIL/uL (ref 3.87–5.11)
RDW: 13.1 % (ref 11.5–15.5)
WBC: 5.8 10*3/uL (ref 4.0–10.5)
nRBC: 0 % (ref 0.0–0.2)

## 2023-09-20 LAB — CBG MONITORING, ED: Glucose-Capillary: 112 mg/dL — ABNORMAL HIGH (ref 70–99)

## 2023-09-20 LAB — TROPONIN I (HIGH SENSITIVITY): Troponin I (High Sensitivity): 4 ng/L (ref <18)

## 2023-09-20 MED ORDER — MIRTAZAPINE 15 MG PO TABS
15.0000 mg | ORAL_TABLET | Freq: Every day | ORAL | Status: DC
  Administered 2023-09-21: 15 mg via ORAL
  Filled 2023-09-20: qty 1

## 2023-09-20 MED ORDER — MAGNESIUM HYDROXIDE 400 MG/5ML PO SUSP: 30.0000 mL | Freq: Every day | ORAL | Status: DC | PRN

## 2023-09-20 MED ORDER — PANTOPRAZOLE SODIUM 40 MG PO TBEC
40.0000 mg | DELAYED_RELEASE_TABLET | Freq: Every day | ORAL | Status: DC
  Administered 2023-09-21: 40 mg via ORAL
  Filled 2023-09-20: qty 1

## 2023-09-20 MED ORDER — SODIUM CHLORIDE 0.9% FLUSH
3.0000 mL | Freq: Two times a day (BID) | INTRAVENOUS | Status: DC
  Administered 2023-09-21: 3 mL via INTRAVENOUS

## 2023-09-20 MED ORDER — ONDANSETRON HCL 4 MG PO TABS: 4.0000 mg | ORAL_TABLET | Freq: Four times a day (QID) | ORAL | Status: DC | PRN

## 2023-09-20 MED ORDER — CARBIDOPA-LEVODOPA ER 50-200 MG PO TBCR
1.0000 | EXTENDED_RELEASE_TABLET | Freq: Every day | ORAL | Status: DC
  Administered 2023-09-21: 1 via ORAL
  Filled 2023-09-20: qty 1

## 2023-09-20 MED ORDER — SODIUM CHLORIDE 0.9 % IV SOLN: INTRAVENOUS | Status: DC

## 2023-09-20 MED ORDER — VITAMIN D (ERGOCALCIFEROL) 1.25 MG (50000 UNIT) PO CAPS: 50000.0000 [IU] | ORAL_CAPSULE | ORAL | Status: DC

## 2023-09-20 MED ORDER — ENOXAPARIN SODIUM 40 MG/0.4ML IJ SOSY
40.0000 mg | PREFILLED_SYRINGE | INTRAMUSCULAR | Status: DC
  Administered 2023-09-21: 40 mg via SUBCUTANEOUS
  Filled 2023-09-20: qty 0.4

## 2023-09-20 MED ORDER — ONDANSETRON HCL 4 MG/2ML IJ SOLN: 4.0000 mg | Freq: Four times a day (QID) | INTRAMUSCULAR | Status: DC | PRN

## 2023-09-20 MED ORDER — EZETIMIBE 10 MG PO TABS
10.0000 mg | ORAL_TABLET | Freq: Every day | ORAL | Status: DC
  Administered 2023-09-21: 10 mg via ORAL
  Filled 2023-09-20: qty 1

## 2023-09-20 MED ORDER — ACETAMINOPHEN 650 MG RE SUPP: 650.0000 mg | Freq: Four times a day (QID) | RECTAL | Status: DC | PRN

## 2023-09-20 MED ORDER — ESCITALOPRAM OXALATE 10 MG PO TABS
5.0000 mg | ORAL_TABLET | Freq: Every day | ORAL | Status: DC
  Administered 2023-09-21: 5 mg via ORAL
  Filled 2023-09-20: qty 1

## 2023-09-20 MED ORDER — TRAZODONE HCL 50 MG PO TABS
25.0000 mg | ORAL_TABLET | Freq: Every evening | ORAL | Status: DC | PRN
  Filled 2023-09-20: qty 1

## 2023-09-20 MED ORDER — ACETAMINOPHEN 325 MG PO TABS
650.0000 mg | ORAL_TABLET | Freq: Four times a day (QID) | ORAL | Status: DC | PRN
  Administered 2023-09-21: 650 mg via ORAL
  Filled 2023-09-20: qty 2

## 2023-09-20 MED ORDER — CARBIDOPA-LEVODOPA 25-100 MG PO TABS
2.0000 | ORAL_TABLET | Freq: Three times a day (TID) | ORAL | Status: DC
  Administered 2023-09-21 (×2): 2 via ORAL
  Filled 2023-09-20 (×5): qty 2

## 2023-09-20 NOTE — H&P (Signed)
 [place]   PATIENT NAME: Julie Lynn    MR#:  [id number]  DATE OF BIRTH:  [birth year]  DATE OF ADMISSION:  09/20/2023  PRIMARY CARE PHYSICIAN: Julie Crome, PA-C   Patient is coming from: Home  REQUESTING/REFERRING PHYSICIAN: Viviano Ground, MD  CHIEF COMPLAINT:   Chief Complaint  Patient presents with   Loss of Consciousness    HISTORY OF PRESENT ILLNESS:  Julie Lynn is a 83 y.o. female with medical history significant for anxiety, hypertension, dyslipidemia, Parkinson's disease, GERD, and depression, who presented to the emergency room with acute onset of syncope.  This was followed by nausea and vomiting.  The patient denied any chest pain or palpitations.  No paresthesias or focal muscle weakness.  No cough or wheezing or dyspnea.  No dysuria, oliguria or hematuria or flank pain.  She has been anxious and has been having mild headache with a distance of blurred vision.  ED Course: When she came to the ER, BP was 145/51 with otherwise normal vital signs.  Labs revealed CMP with blood glucose of 136 and normal levels as well as.  CBC was within normal.  UA was negative. EKG as reviewed by me : EKG showed normal sinus rhythm with rate of 67 with right bundle branch block. Imaging: Noncontrasted head CT scan revealed mild sphenoid sinus disease, no acute intracranial normalities and generalized cerebral atrophy and microvascular disease of the supratentorial brain.  The patient will be admitted to a medical telemetry observation bed for further evaluation and management. PAST MEDICAL HISTORY:   Past Medical History:  Diagnosis Date   Anxiety    Cancer (HCC) 05/2017   skin   Depression    GERD (gastroesophageal reflux disease)    Heartburn    Hepatitis A 1962   HLD (hyperlipidemia)    HTN (hypertension)    Hypertension    Parkinson disease (HCC)    Scoliosis    Vertigo    rare    PAST SURGICAL HISTORY:   Past Surgical History:  Procedure Laterality Date    ABDOMINAL HYSTERECTOMY  1977   ABDOMINAL HYSTERECTOMY     CATARACT EXTRACTION W/PHACO Left 11/01/2017   Procedure: CATARACT EXTRACTION PHACO AND INTRAOCULAR LENS PLACEMENT (IOC) LEFT;  Surgeon: Annell Kidney, MD;  Location: [facility];  Service: Ophthalmology;  Laterality: Left;   CATARACT EXTRACTION W/PHACO Right 12/12/2018   Procedure: CATARACT EXTRACTION PHACO AND INTRAOCULAR LENS PLACEMENT (IOC)  RIGHT;  Surgeon: Annell Kidney, MD;  Location: [facility];  Service: Ophthalmology;  Laterality: Right;   COLONOSCOPY WITH PROPOFOL  N/A 06/15/2018   Procedure: COLONOSCOPY WITH PROPOFOL ;  Surgeon: Deveron Fly, MD;  Location: [facility];  Service: Endoscopy;  Laterality: N/A;   ESOPHAGOGASTRODUODENOSCOPY (EGD) WITH PROPOFOL  N/A 06/15/2018   Procedure: ESOPHAGOGASTRODUODENOSCOPY (EGD) WITH PROPOFOL ;  Surgeon: Deveron Fly, MD;  Location: [facility];  Service: Endoscopy;  Laterality: N/A;    SOCIAL HISTORY:   Social History   Tobacco Use   Smoking status: Never   Smokeless tobacco: Never  Substance Use Topics   Alcohol use: Not Currently    FAMILY HISTORY:   Family History  Problem Relation Age of Onset   Breast cancer Mother 15   Kidney cancer Neg Hx    Bladder Cancer Neg Hx     DRUG ALLERGIES:   Allergies  Allergen Reactions   Tramadol Swelling   Nitrofuran Derivatives Nausea Only   Simvastatin Other (See Comments)    Muscle pain and  wasting, liver function abnormalities Other reaction(s): Other (See Comments) Other reaction(s): Other (See Comments) Muscle pain and wasting, liver function abnormalities Unspecified   Ultram [Tramadol Hcl] Swelling   Adhesive [Tape] Rash    Surgical tape   Wound Dressing Adhesive Rash    Surgical tape    REVIEW OF SYSTEMS:   ROS As per history of present illness. All pertinent systems were reviewed above. Constitutional, HEENT, cardiovascular, respiratory, GI, GU, musculoskeletal,  neuro, psychiatric, endocrine, integumentary and hematologic systems were reviewed and are otherwise negative/unremarkable except for positive findings mentioned above in the HPI.   MEDICATIONS AT HOME:   Prior to Admission medications   Medication Sig Start Date End Date Taking? Authorizing Provider  amLODipine  (NORVASC ) 5 MG tablet Take 2.5 mg by mouth daily as needed.    [provider]  carbidopa -levodopa  (SINEMET  CR) 50-200 MG tablet Take 1 tablet by mouth at bedtime.    [provider]  Carbidopa -Levodopa  ER (SINEMET  CR) 25-100 MG tablet controlled release Take 2 tablets by mouth 3 (three) times daily.    [provider]  escitalopram (LEXAPRO) 5 MG tablet Take 5 mg by mouth daily.    [provider]  ezetimibe (ZETIA) 10 MG tablet Take 10 mg by mouth daily. 07/19/16   [provider]  meloxicam (MOBIC) 7.5 MG tablet Take 7.5 mg by mouth daily as needed for pain. 04/19/23 04/18/24  [provider]  mirtazapine (REMERON) 15 MG tablet Take 15 mg by mouth at bedtime. 05/17/21   [provider]  omeprazole (PRILOSEC) 20 MG capsule Take 20 mg by mouth daily. 05/24/16   [provider]  Vitamin D, Ergocalciferol, (DRISDOL) 50000 units CAPS capsule Take 50,000 Units by mouth every 7 (seven) days. Every Wed 03/08/16   [provider]      VITAL SIGNS:  Blood pressure (!) 152/69, pulse 65, temperature 97.8 F (36.6 C), temperature source Oral, resp. rate 17, height 5\' 4"  (1.626 m), weight 58.7 kg, SpO2 99%.  PHYSICAL EXAMINATION:  Physical Exam  GENERAL:  83 y.o.-year-old Caucasian female patient lying in the bed with no acute distress.  EYES: Pupils equal, round, reactive to light and accommodation. No scleral icterus. Extraocular muscles intact.  HEENT: Head atraumatic, normocephalic. Oropharynx and nasopharynx clear.  NECK:  Supple, no jugular venous distention. No thyroid enlargement, no tenderness.  LUNGS:  Normal breath sounds bilaterally, no wheezing, rales,rhonchi or crepitation. No use of accessory muscles of respiration.  CARDIOVASCULAR: Regular rate and rhythm, S1, S2 normal. No murmurs, rubs, or gallops.  ABDOMEN: Soft, nondistended, nontender. Bowel sounds present. No organomegaly or mass.  EXTREMITIES: No pedal edema, cyanosis, or clubbing.  NEUROLOGIC: Cranial nerves II through XII are intact. Muscle strength 5/5 in all extremities. Sensation intact. Gait not checked.  PSYCHIATRIC: The patient is alert and oriented x 3.  Normal affect and good eye contact. SKIN: No obvious rash, lesion, or ulcer.   LABORATORY PANEL:   CBC Recent Labs  Lab 09/20/23 1940  WBC 5.8  HGB 14.4  HCT 42.2  PLT 198   ------------------------------------------------------------------------------------------------------------------  Chemistries  Recent Labs  Lab 09/20/23 1940  NA 139  K 3.8  CL 106  CO2 26  GLUCOSE 136*  BUN 19  CREATININE 0.92  CALCIUM 9.7  AST 25  ALT 6  ALKPHOS 55  BILITOT 1.0   ------------------------------------------------------------------------------------------------------------------  Cardiac Enzymes No results for input(s): "TROPONINI" in the last 168 hours. ------------------------------------------------------------------------------------------------------------------  RADIOLOGY:  CT Head Wo Contrast Result Date: 09/20/2023  CLINICAL DATA:  Syncope. EXAM: CT HEAD WITHOUT CONTRAST TECHNIQUE: Contiguous axial images were obtained from the base of the skull through the vertex without intravenous contrast. RADIATION DOSE REDUCTION: This exam was performed according to the departmental dose-optimization program which includes automated exposure control, adjustment of the mA and/or kV according to patient size and/or use of iterative reconstruction technique. COMPARISON:  August 21, 2023 FINDINGS: Brain: There is generalized cerebral atrophy with widening of the  extra-axial spaces and ventricular dilatation. There are areas of decreased attenuation within the white matter tracts of the supratentorial brain, consistent with microvascular disease changes. Vascular: No hyperdense vessel or unexpected calcification. Skull: Normal. Negative for fracture or focal lesion. Sinuses/Orbits: There is mild sphenoid sinus mucosal thickening. Other: None. IMPRESSION: 1. Generalized cerebral atrophy and microvascular disease changes of the supratentorial brain. 2. No acute intracranial abnormality. 3. Mild sphenoid sinus disease. Electronically Signed   By: Virgle Grime M.D.   On: 09/20/2023 22:53      IMPRESSION AND PLAN:  Assessment and Plan: * Syncope - The patient will be admitted to an observation medically monitored bed. - Will check orthostatics q 12 hours. - Will obtain a bilateral carotid Doppler and 2D echo. - The patient will be gently hydrated with IV normal saline and monitored for arrhythmias. -Differential diagnoses would include neurally mediated syncope, cardiogenic, arrhythmias related,  orthostatic hypotension and less likely hypoglycemia.    GERD without esophagitis - Will continue PPI therapy.  Depression We will continue antidepressant therapy.  Parkinson disease (HCC) - Will continue Sinemet  ER.  Essential hypertension - Will continue antihypertensive therapy.       DVT prophylaxis: Lovenox. Advanced Care Planning:  Code Status: The patient is DNR on and DNI. Family Communication:  The plan of care was discussed in details with the patient (and family). I answered all questions. The patient agreed to proceed with the above mentioned plan. Further management will depend upon hospital course. Disposition Plan: Back to previous home environment Consults called: none. All the records are reviewed and case discussed with ED provider.  Status is: Observation  I certify that at the time of admission, it is my clinical judgment  that the patient will require  hospital care extending past than 2 midnights.                            Dispo: The patient is from: Home              Anticipated d/c is to: Home              Patient currently is not medically stable to d/c.              Difficult to place patient: No  Virgene Griffin M.D on 09/21/2023 at 3:27 AM  [facility]   From 7 PM-7 AM, contact night-coverage [email or web address]  CC: Primary care physician; Julie Crome, PA-C

## 2023-09-20 NOTE — ED Triage Notes (Signed)
 Patient arrives from home by [facility] after having reportedly having a syncopal episode.  Patient states she was in the kitchen after dinner, standing at the sink.  She says that she called out to her aide that she did not feel well and vomited.  She says she lowered herself to the floor and passed out.  She denies hitting her head or injuring herself during the fall.

## 2023-09-20 NOTE — ED Provider Notes (Signed)
   [facility] Provider Note    Event Date/Time   First MD Initiated Contact with Patient 09/20/23 1926     (approximate)   History   Loss of Consciousness   HPI  Julie Lynn is a 83 y.o. female past medical history significant for Parkinson's, dementia, who presents to the emergency department following a syncopal episode.  History is provided by the patient's daughter who is at bedside.     Physical Exam   Triage Vital Signs: ED Triage Vitals  Encounter Vitals Group     BP 09/20/23 1932 (!) 105/51     Systolic BP Percentile --      Diastolic BP Percentile --      Pulse Rate 09/20/23 1932 71     Resp 09/20/23 1932 16     Temp 09/20/23 1932 97.6 F (36.4 C)     Temp Source 09/20/23 1932 Oral     SpO2 09/20/23 1932 100 %     Weight 09/20/23 1935 129 lb 4.8 oz (58.7 kg)     Height 09/20/23 1935 5\' 4"  (1.626 m)     Head Circumference --      Peak Flow --      Pain Score 09/20/23 1935 0     Pain Loc --      Pain Education --      Exclude from Growth Chart --     Most recent vital signs: Vitals:   09/20/23 1932 09/20/23 1943  BP: (!) 105/51   Pulse: 71   Resp: 16   Temp: 97.6 F (36.4 C)   SpO2: 100% 100%    Physical Exam  IMPRESSION / MDM / ASSESSMENT AND PLAN / ED COURSE  I reviewed the triage vital signs and the nursing notes.  *** EKG  I, Viviano Ground, the attending physician, personally viewed and interpreted this ECG.   Rate: Normal  Rhythm: Normal sinus  Axis: Normal  Intervals: Normal  ST&T Change: None  No tachycardic or bradycardic dysrhythmias while on cardiac telemetry.  RADIOLOGY I independently reviewed imaging, my interpretation of imaging: ***  LABS (all labs ordered are listed, but only abnormal results are displayed) Labs interpreted as -    Labs Reviewed  COMPREHENSIVE METABOLIC PANEL WITH GFR - Abnormal; Notable for the following components:      Result Value   Glucose, Bld 136 (*)    All other  components within normal limits  CBG MONITORING, ED - Abnormal; Notable for the following components:   Glucose-Capillary 112 (*)    All other components within normal limits  CBC  URINALYSIS, ROUTINE W REFLEX MICROSCOPIC     MDM       PROCEDURES:  Critical Care performed: No  Procedures  Patient's presentation is most consistent with {EM COPA:27473}   MEDICATIONS ORDERED IN ED: Medications - No data to display  FINAL CLINICAL IMPRESSION(S) / ED DIAGNOSES   Final diagnoses:  None     Rx / DC Orders   ED Discharge Orders     None        Note:  This document was prepared using Dragon voice recognition software and may include unintentional dictation errors.

## 2023-09-20 NOTE — ED Provider Notes (Incomplete)
 [facility] Provider Note    Event Date/Time   First MD Initiated Contact with Patient 09/20/23 1926     (approximate)   History   Loss of Consciousness   HPI  Julie Lynn is a 84 y.o. female past medical history significant for Parkinson's, dementia, who presents to the emergency department following a syncopal episode.  History is provided by the patient's daughter who is at bedside.  States earlier today had an episode whenever she was sitting with her caregiver where she was eating food and then all of a sudden lost consciousness and slumped over.  Immediately came to and then had an episode of nausea and vomiting.  Mildly off since the past couple of days according to caregivers.  Patient without any complaints at this time.  Denies any chest pain or shortness of breath.  Denies any falls or trauma.  Initially blood pressure was 77/60.  Repeat blood pressure improved to 152/79.     Physical Exam   Triage Vital Signs: ED Triage Vitals  Encounter Vitals Group     BP 09/20/23 1932 (!) 105/51     Systolic BP Percentile --      Diastolic BP Percentile --      Pulse Rate 09/20/23 1932 71     Resp 09/20/23 1932 16     Temp 09/20/23 1932 97.6 F (36.4 C)     Temp Source 09/20/23 1932 Oral     SpO2 09/20/23 1932 100 %     Weight 09/20/23 1935 129 lb 4.8 oz (58.7 kg)     Height 09/20/23 1935 5\' 4"  (1.626 m)     Head Circumference --      Peak Flow --      Pain Score 09/20/23 1935 0     Pain Loc --      Pain Education --      Exclude from Growth Chart --     Most recent vital signs: Vitals:   09/20/23 1932 09/20/23 1943  BP: (!) 105/51   Pulse: 71   Resp: 16   Temp: 97.6 F (36.4 C)   SpO2: 100% 100%    Physical Exam Constitutional:      Appearance: She is well-developed.  HENT:     Head: Atraumatic.  Eyes:     Conjunctiva/sclera: Conjunctivae normal.  Cardiovascular:     Rate and Rhythm: Regular rhythm.  Pulmonary:     Effort: No  respiratory distress.  Abdominal:     General: There is no distension.  Musculoskeletal:        General: Normal range of motion.     Cervical back: Normal range of motion.  Skin:    General: Skin is warm.     Capillary Refill: Capillary refill takes less than 2 seconds.  Neurological:     Mental Status: She is alert. Mental status is at baseline.     IMPRESSION / MDM / ASSESSMENT AND PLAN / ED COURSE  I reviewed the triage vital signs and the nursing notes.  Differential diagnosis including dysrhythmia, intracranial hemorrhage, infectious process, electrolyte abnormality, ACS  EKG  I, Viviano Ground, the attending physician, personally viewed and interpreted this ECG.   Rate: Normal  Rhythm: Normal sinus  Axis: Normal  Intervals: Normal  ST&T Change: None Right bundle branch block  No tachycardic or bradycardic dysrhythmias while on cardiac telemetry.  RADIOLOGY CT scan of the head was read as no acute findings. LABS (all labs ordered are  listed, but only abnormal results are displayed) Labs interpreted as -    Labs Reviewed  COMPREHENSIVE METABOLIC PANEL WITH GFR - Abnormal; Notable for the following components:      Result Value   Glucose, Bld 136 (*)    All other components within normal limits  CBG MONITORING, ED - Abnormal; Notable for the following components:   Glucose-Capillary 112 (*)    All other components within normal limits  CBC  URINALYSIS, ROUTINE W REFLEX MICROSCOPIC     MDM    Patient episode while in the emergency department where the alarm was reading her heart rate in the 160s.  Telemetry strip at that time was reading V. tach/V-fib -    PROCEDURES:  Critical Care performed: No  Procedures  Patient's presentation is most consistent with {EM COPA:27473}   MEDICATIONS ORDERED IN ED: Medications - No data to display  FINAL CLINICAL IMPRESSION(S) / ED DIAGNOSES   Final diagnoses:  None     Rx / DC Orders   ED Discharge  Orders     None        Note:  This document was prepared using Dragon voice recognition software and may include unintentional dictation errors.

## 2023-09-20 NOTE — ED Notes (Signed)
 Patient transported to CT

## 2023-09-21 ENCOUNTER — Encounter: Payer: Self-pay | Admitting: Family Medicine

## 2023-09-21 ENCOUNTER — Observation Stay
Admit: 2023-09-21 | Discharge: 2023-09-21 | Disposition: A | Discharge status: Home / Self Care | Attending: Family Medicine

## 2023-09-21 ENCOUNTER — Observation Stay

## 2023-09-21 DIAGNOSIS — K219 Gastro-esophageal reflux disease without esophagitis: Secondary | ICD-10-CM | POA: Diagnosis not present

## 2023-09-21 DIAGNOSIS — I10 Essential (primary) hypertension: Secondary | ICD-10-CM | POA: Insufficient documentation

## 2023-09-21 DIAGNOSIS — F32A Depression, unspecified: Secondary | ICD-10-CM | POA: Insufficient documentation

## 2023-09-21 DIAGNOSIS — G20B2 Parkinson's disease with dyskinesia, with fluctuations: Secondary | ICD-10-CM

## 2023-09-21 DIAGNOSIS — F02818 Dementia in other diseases classified elsewhere, unspecified severity, with other behavioral disturbance: Secondary | ICD-10-CM | POA: Insufficient documentation

## 2023-09-21 DIAGNOSIS — G20A1 Parkinson's disease without dyskinesia, without mention of fluctuations: Secondary | ICD-10-CM | POA: Insufficient documentation

## 2023-09-21 DIAGNOSIS — R55 Syncope and collapse: Secondary | ICD-10-CM | POA: Diagnosis not present

## 2023-09-21 LAB — CBC
HCT: 36.7 % (ref 36.0–46.0)
Hemoglobin: 12.7 g/dL (ref 12.0–15.0)
MCH: 32.1 pg (ref 26.0–34.0)
MCHC: 34.6 g/dL (ref 30.0–36.0)
MCV: 92.7 fL (ref 80.0–100.0)
Platelets: 182 10*3/uL (ref 150–400)
RBC: 3.96 MIL/uL (ref 3.87–5.11)
RDW: 12.9 % (ref 11.5–15.5)
WBC: 6.1 10*3/uL (ref 4.0–10.5)
nRBC: 0 % (ref 0.0–0.2)

## 2023-09-21 LAB — GLUCOSE, CAPILLARY
Glucose-Capillary: 89 mg/dL (ref 70–99)
Glucose-Capillary: 93 mg/dL (ref 70–99)

## 2023-09-21 LAB — BASIC METABOLIC PANEL WITH GFR
Anion gap: 7 (ref 5–15)
BUN: 16 mg/dL (ref 8–23)
CO2: 25 mmol/L (ref 22–32)
Calcium: 8.8 mg/dL — ABNORMAL LOW (ref 8.9–10.3)
Chloride: 108 mmol/L (ref 98–111)
Creatinine, Ser: 0.82 mg/dL (ref 0.44–1.00)
GFR, Estimated: >60 mL/min (ref >60)
Glucose, Bld: 90 mg/dL (ref 70–99)
Potassium: 3.6 mmol/L (ref 3.5–5.1)
Sodium: 140 mmol/L (ref 135–145)

## 2023-09-21 NOTE — Progress Notes (Signed)
 Patient discharging home with daughter via personal car. Patient discharge instructions given to patient and daughter. Yellow DNR form returned to daughter for her to take home.

## 2023-09-21 NOTE — Discharge Summary (Signed)
 Physician Discharge Summary   Patient: Julie Lynn MRN: [id number] DOB: [birth year]  Admit date:     09/20/2023  Discharge date: 09/21/23  Discharge Physician: Roise Cleaver   PCP: Clarinda Crome, PA-C   Recommendations at discharge:   Follow-up with primary care for continued blood pressure monitoring Follow-up with neurology for Parkinson's disease management  Discharge Diagnoses: Principal Problem:   Syncope Active Problems:   Essential hypertension   Parkinson disease (HCC)   Depression   GERD without esophagitis  Resolved Problems:   * No resolved hospital problems. *  Hospital Course: Julie Lynn is an 83 year old female with anxiety, hypertension, dyslipidemia, Parkinson's disease, GERD, depression, who presents to the ED with acute onset syncope followed by nausea vomiting.  In the ED vital signs were normal.  CMP and CBC mostly unremarkable.  EKG reveals NSR with right bundle branch block.  Noncon head CT revealed mild sphenoid sinus disease but no acute intracranial abnormalities patient was admitted for observation.  Syncope workup was mostly unremarkable, as seen below.  Patient did experience wide ranges in blood pressure during her admission.  I suspect that this dysautonomia is playing a role in her recurrent falls and syncope.  Had extensive discussion with the patient as well as with her daughter at bedside.  The patient was very anxious for discharge home.  The patient's daughter ensures that they have 24/7 help inside the home.  We discussed fall precautions including always using a walker, and pausing before beginning to walk after changing positions to better adapt for orthostatic hypotension.  The patient endorses understanding.  At time of discharge echocardiogram still pending, but patient has requested for discharge home.  I will call them with notable results.  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 DDx: Vasovagal, orthostatic  hypotension, arrhythmia, seizure - Patient has had 3 prior ED visits in the last 60 days for falls - CBC and CMP within normal limits - Troponin 4 - Glucose 90 - Bilateral carotid Dopplers: Moderate calcified plaque of the distal left common carotid extending into the proximal internal carotid less than 50% stenosis.  Less than 50% stenosis of right ICA. - No events on telemetry - Orthostatics negative - Echocardiogram: Still pending at time of discharge --Home health PT/OT ordered.  Hypertension - Blood pressure appears very labile, in her short 12-hour admission she has had blood pressures recorded as high as 198/88 (which self resolved without intervention) and as low as 77/60.  Suspect this is playing a role in her syncope.  See above. - Only medication at home is amlodipine  2.5, given risk of orthostatic hypotension and dysautonomia I have recommended to discontinue amlodipine  altogether.  Continue keeping a blood pressure log.  Follow-up with PCP for further monitoring   Frequent falls - Multifactorial.  Suspect syncope and blood pressure playing a role.  Further complicated by Parkinson's disease with gait disturbance - Currently working with physical therapy outpatient - HH PT/OT ordered - Necessitates walker outpatient   GERD without esophagitis - Continue PPI   Depression Anxiety - Continue home meds   Parkinson's disease - Resume Sinemet  at home dose   Consultants: n/a Procedures performed: n/a  Disposition: Home health Diet recommendation:  Discharge Diet Orders (From admission, onward)     Start     Ordered   09/21/23 0000  Diet general        09/21/23 1524           Regular diet DISCHARGE MEDICATION: Allergies  as of 09/21/2023       Reactions   Tramadol Swelling   Nitrofuran Derivatives Nausea Only   Simvastatin Other (See Comments)   Muscle pain and wasting, liver function abnormalities Other reaction(s): Other (See Comments) Other reaction(s):  Other (See Comments) Muscle pain and wasting, liver function abnormalities Unspecified   Ultram [tramadol Hcl] Swelling   Adhesive [tape] Rash   Surgical tape   Wound Dressing Adhesive Rash   Surgical tape        Medication List     STOP taking these medications    amLODipine  5 MG tablet Commonly known as: NORVASC        TAKE these medications    carbidopa -levodopa  50-200 MG tablet Commonly known as: SINEMET  CR Take 1 tablet by mouth at bedtime. What changed: Another medication with the same name was changed. Make sure you understand how and when to take each.   carbidopa -levodopa  25-100 MG tablet Commonly known as: SINEMET  IR Take 2 tablets by mouth 4 (four) times daily. What changed: additional instructions   escitalopram 5 MG tablet Commonly known as: LEXAPRO Take 5 mg by mouth daily.   ezetimibe 10 MG tablet Commonly known as: ZETIA Take 10 mg by mouth daily.   meloxicam 7.5 MG tablet Commonly known as: MOBIC Take 7.5 mg by mouth daily as needed for pain.   mirtazapine 15 MG tablet Commonly known as: REMERON Take 15 mg by mouth at bedtime.   omeprazole 20 MG capsule Commonly known as: PRILOSEC Take 20 mg by mouth daily.   Vitamin D (Ergocalciferol) 1.25 MG (50000 UNIT) Caps capsule Commonly known as: DRISDOL Take 50,000 Units by mouth every 7 (seven) days. Every Wed        Discharge Exam: Filed Weights   09/20/23 1935 09/21/23 0500  Weight: 58.7 kg 54.3 kg   Constitutional:  Normal appearance. Non toxic-appearing.  HENT: Head Normocephalic and atraumatic.  Mucous membranes are moist.  Eyes:  Extraocular intact. Conjunctivae normal. Pupils are equal, round, and reactive to light.  Cardiovascular: Rate and Rhythm: Normal rate and regular rhythm.  Pulmonary: Non labored, symmetric rise of chest wall.  Musculoskeletal:  Normal range of motion.  Skin: warm and dry. not jaundiced.  Neurological: Speech is slow, difficulty with word finding,  appears to have appropriate comprehension.  Follows commands. Psychiatric: Mood and Affect congruent.    Condition at discharge: stable  Discharge Instructions     Call MD for:  difficulty breathing, headache or visual disturbances   Complete by: As directed    Call MD for:  persistant dizziness or light-headedness   Complete by: As directed    Call MD for:  persistant nausea and vomiting   Complete by: As directed    Call MD for:  severe uncontrolled pain   Complete by: As directed    Call MD for:  temperature >100.4   Complete by: As directed    Diet general   Complete by: As directed    Discharge instructions   Complete by: As directed    Continue taking your Parkinson's medications as instructed by your neurologist.    Follow-up with primary care for continued blood pressure monitoring.  For now, discontinue amlodipine .  Continue to track your blood pressure at least once daily and keep a log for review with your primary care physician.  When you change positions including going from laying to sitting, or sitting to standing, please pause for 60 seconds before you begin moving to allow time for  your blood pressure to adjust and decrease dizziness   Face-to-face encounter (required for Medicare/Medicaid patients)   Complete by: As directed    I Roise Cleaver certify that this patient is under my care and that I, or a nurse practitioner or physician's assistant working with me, had a face-to-face encounter that meets the physician face-to-face encounter requirements with this patient on 09/21/2023. The encounter with the patient was in whole, or in part for the following medical condition(s) which is the primary reason for home health care (List medical condition): anxiety, hypertension, dyslipidemia, Parkinson's disease, GERD, depression, frequent falls   The encounter with the patient was in whole, or in part, for the following medical condition, which is the primary reason for  home health care: anxiety, hypertension, dyslipidemia, Parkinson's disease, GERD, depression, frequent falls   I certify that, based on my findings, the following services are medically necessary home health services: Physical therapy   Reason for Medically Necessary Home Health Services:  Therapy- Instruction on Safe use of Assistive Devices for ADLs Therapy- Therapeutic Exercises to Increase Strength and Endurance     My clinical findings support the need for the above services:  Unsafe ambulation due to balance issues Unable to leave home safely without assistance and/or assistive device Cognitive impairments, dementia, or mental confusion  that make it unsafe to leave home     Further, I certify that my clinical findings support that this patient is homebound due to:  Unable to leave home safely without assistance Mental confusion Unsafe ambulation due to balance issues     Home Health   Complete by: As directed    To provide the following care/treatments:  OT PT Home Health Aide     Increase activity slowly   Complete by: As directed         The results of significant diagnostics from this hospitalization (including imaging, microbiology, ancillary and laboratory) are listed below for reference.   Imaging Studies: US  Carotid Bilateral Result Date: 09/21/2023 CLINICAL DATA:  Syncope.  Hypertension.  Hyperlipidemia. EXAM: BILATERAL CAROTID DUPLEX ULTRASOUND TECHNIQUE: Martina Sledge scale imaging, color Doppler and duplex ultrasound were performed of bilateral carotid and vertebral arteries in the neck. COMPARISON:  None available FINDINGS: Criteria: Quantification of carotid stenosis is based on velocity parameters that correlate the residual internal carotid diameter with NASCET-based stenosis levels, using the diameter of the distal internal carotid lumen as the denominator for stenosis measurement. The following velocity measurements were obtained: RIGHT ICA: 48/13 cm/sec CCA: 67/8 cm/sec  SYSTOLIC ICA/CCA RATIO:  0.7 ECA: 114 cm/sec LEFT ICA: 72/15 cm/sec CCA: 55/10 cm/sec SYSTOLIC ICA/CCA RATIO:  1.3 ECA: 82 cm/sec RIGHT CAROTID ARTERY: No significant atheromatous plaque. RIGHT VERTEBRAL ARTERY:  Antegrade flow. LEFT CAROTID ARTERY: Moderate calcified plaque noted in the distal LEFT common carotid artery extending into the proximal internal carotid artery. LEFT VERTEBRAL ARTERY:  Antegrade flow. IMPRESSION: 1. Moderate calcified plaque of the distal LEFT common carotid artery extending into the proximal internal carotid artery with Doppler measurements indicative of less than 50% stenosis. 2. Less than 50% stenosis of the RIGHT internal carotid artery. Electronically Signed   By: Elester Grim M.D.   On: 09/21/2023 08:56   CT Head Wo Contrast Result Date: 09/20/2023 CLINICAL DATA:  Syncope. EXAM: CT HEAD WITHOUT CONTRAST TECHNIQUE: Contiguous axial images were obtained from the base of the skull through the vertex without intravenous contrast. RADIATION DOSE REDUCTION: This exam was performed according to the departmental dose-optimization program which includes automated exposure  control, adjustment of the mA and/or kV according to patient size and/or use of iterative reconstruction technique. COMPARISON:  August 21, 2023 FINDINGS: Brain: There is generalized cerebral atrophy with widening of the extra-axial spaces and ventricular dilatation. There are areas of decreased attenuation within the white matter tracts of the supratentorial brain, consistent with microvascular disease changes. Vascular: No hyperdense vessel or unexpected calcification. Skull: Normal. Negative for fracture or focal lesion. Sinuses/Orbits: There is mild sphenoid sinus mucosal thickening. Other: None. IMPRESSION: 1. Generalized cerebral atrophy and microvascular disease changes of the supratentorial brain. 2. No acute intracranial abnormality. 3. Mild sphenoid sinus disease. Electronically Signed   By: Virgle Grime  M.D.   On: 09/20/2023 22:53    Microbiology: Results for orders placed or performed during the hospital encounter of 05/13/21  Urine Culture     Status: Abnormal   Collection Time: 05/13/21 11:07 AM   Specimen: Urine, Random  Result Value Ref Range Status   Specimen Description   Final    URINE, RANDOM Performed at [facility], [address] [place], [place] [address]    Special Requests   Final    NONE Performed at [facility], [address] [place], [place] [address]    Culture (A)  Final    20,000 COLONIES/mL ESCHERICHIA COLI Confirmed Extended Spectrum Beta-Lactamase Producer (ESBL).  In bloodstream infections from ESBL organisms, carbapenems are preferred over piperacillin/tazobactam. They are shown to have a lower risk of mortality.    Report Status 05/15/2021 FINAL  Final   Organism ID, Bacteria ESCHERICHIA COLI (A)  Final      Susceptibility   Escherichia coli - MIC*    AMPICILLIN >=32 RESISTANT Resistant     CEFAZOLIN >=64 RESISTANT Resistant     CEFEPIME 16 RESISTANT Resistant     CEFTRIAXONE >=64 RESISTANT Resistant     CIPROFLOXACIN >=4 RESISTANT Resistant     GENTAMICIN <=1 SENSITIVE Sensitive     IMIPENEM <=0.25 SENSITIVE Sensitive     NITROFURANTOIN  <=16 SENSITIVE Sensitive     TRIMETH/SULFA >=320 RESISTANT Resistant     AMPICILLIN/SULBACTAM >=32 RESISTANT Resistant     PIP/TAZO 8 SENSITIVE Sensitive     * 20,000 COLONIES/mL ESCHERICHIA COLI  Resp Panel by RT-PCR (Flu A&B, Covid) Nasopharyngeal Swab     Status: None   Collection Time: 05/13/21 11:09 AM   Specimen: Nasopharyngeal Swab; Nasopharyngeal(NP) swabs in vial transport medium  Result Value Ref Range Status   SARS Coronavirus 2 by RT PCR NEGATIVE NEGATIVE Final    Comment: (NOTE) SARS-CoV-2 target nucleic acids are NOT DETECTED.  The SARS-CoV-2 RNA is generally detectable in upper respiratory specimens during the acute phase of infection. The lowest concentration of  SARS-CoV-2 viral copies this assay can detect is 138 copies/mL. A negative result does not preclude SARS-Cov-2 infection and should not be used as the sole basis for treatment or other patient management decisions. A negative result may occur with  improper specimen collection/handling, submission of specimen other than nasopharyngeal swab, presence of viral mutation(s) within the areas targeted by this assay, and inadequate number of viral copies(<138 copies/mL). A negative result must be combined with clinical observations, patient history, and epidemiological information. The expected result is Negative.  Fact Sheet for Patients:  BloggerCourse.com  Fact Sheet for Healthcare Providers:  SeriousBroker.it  This test is no t yet approved or cleared by the [place]  FDA and  has been authorized for detection and/or diagnosis of SARS-CoV-2 by FDA under an Emergency  Use Authorization (EUA). This EUA will remain  in effect (meaning this test can be used) for the duration of the COVID-19 declaration under Section 564(b)(1) of the Act, 21 U.S.C.section 360bbb-3(b)(1), unless the authorization is terminated  or revoked sooner.       Influenza A by PCR NEGATIVE NEGATIVE Final   Influenza B by PCR NEGATIVE NEGATIVE Final    Comment: (NOTE) The Xpert Xpress SARS-CoV-2/FLU/RSV plus assay is intended as an aid in the diagnosis of influenza from Nasopharyngeal swab specimens and should not be used as a sole basis for treatment. Nasal washings and aspirates are unacceptable for Xpert Xpress SARS-CoV-2/FLU/RSV testing.  Fact Sheet for Patients: BloggerCourse.com  Fact Sheet for Healthcare Providers: SeriousBroker.it  This test is not yet approved or cleared by the [place]  FDA and has been authorized for detection and/or diagnosis of SARS-CoV-2 by FDA under an Emergency Use  Authorization (EUA). This EUA will remain in effect (meaning this test can be used) for the duration of the COVID-19 declaration under Section 564(b)(1) of the Act, 21 U.S.C. section 360bbb-3(b)(1), unless the authorization is terminated or revoked.  Performed at [facility], [address] [place], [place] [address]     Labs: CBC: Recent Labs  Lab 09/20/23 1940 09/21/23 0500  WBC 5.8 6.1  HGB 14.4 12.7  HCT 42.2 36.7  MCV 93.4 92.7  PLT 198 182   Basic Metabolic Panel: Recent Labs  Lab 09/20/23 1940 09/21/23 0500  NA 139 140  K 3.8 3.6  CL 106 108  CO2 26 25  GLUCOSE 136* 90  BUN 19 16  CREATININE 0.92 0.82  CALCIUM 9.7 8.8*   Liver Function Tests: Recent Labs  Lab 09/20/23 1940  AST 25  ALT 6  ALKPHOS 55  BILITOT 1.0  PROT 6.9  ALBUMIN 3.8   CBG: Recent Labs  Lab 09/20/23 1951 09/21/23 0457 09/21/23 0907  GLUCAP 112* 89 93    Discharge time spent: 32 minutes.  Signed: Roshonda Sperl, DO [facility] 09/21/2023

## 2023-09-21 NOTE — Assessment & Plan Note (Signed)
Will continue antihypertensive therapy.

## 2023-09-21 NOTE — Progress Notes (Signed)
*  PRELIMINARY RESULTS* Echocardiogram 2D Echocardiogram has been performed.  Julie Lynn 09/21/2023, 2:50 PM

## 2023-09-21 NOTE — Assessment & Plan Note (Signed)
-   Will continue Sinemet  ER.

## 2023-09-21 NOTE — Plan of Care (Signed)

## 2023-09-21 NOTE — Evaluation (Signed)
 Physical Therapy Evaluation Patient Details Name: Julie Lynn MRN: [id number] DOB: November 23, 1940 Today's Date: 09/21/2023  History of Present Illness  Julie Lynn is a 83 y.o. female past medical history significant for Parkinson's, dementia, who presents to the emergency department following a syncopal episode.  Clinical Impression  Patient received in bed, daughter at bedside. She is agreeable to PT assessment. Patient requires min A for bed mobility. Transfers with cga. She is able to ambulate 60 feet with RW and cga. No lob, or unsteadiness noted with use of RW. Orthostatic vitals taken and was negative. Patient appears to be at baseline level of mobility and has 24/7 caregivers. She does not require continued skilled PT at this time. Signing off.          If plan is discharge home, recommend the following: A little help with walking and/or transfers;A little help with bathing/dressing/bathroom;Assist for transportation;Assistance with cooking/housework;Direct supervision/assist for financial management;Direct supervision/assist for medications management   Can travel by private vehicle    yes    Equipment Recommendations None recommended by PT  Recommendations for Other Services    N/a   Functional Status Assessment Patient has not had a recent decline in their functional status     Precautions / Restrictions Precautions Precautions: Fall Recall of Precautions/Restrictions: Impaired Restrictions Weight Bearing Restrictions Per Provider Order: No      Mobility  Bed Mobility Overal bed mobility: Needs Assistance Bed Mobility: Supine to Sit, Sit to Supine     Supine to sit: Min assist, HOB elevated, Used rails Sit to supine: Min assist   General bed mobility comments: patient requires increased time and min A    Transfers Overall transfer level: Needs assistance Equipment used: Rolling walker (2 wheels) Transfers: Sit to/from Stand Sit to Stand: Contact guard  assist                Ambulation/Gait Ambulation/Gait assistance: Contact guard assist Gait Distance (Feet): 60 Feet Assistive device: Rolling walker (2 wheels) Gait Pattern/deviations: Step-through pattern, Decreased step length - right, Decreased step length - left, Decreased stride length, Narrow base of support Gait velocity: decr     General Gait Details: patient ambulates with cues for safety, cga, slow pace. No lob  Stairs            Wheelchair Mobility     Tilt Bed    Modified Rankin (Stroke Patients Only)       Balance Overall balance assessment: Needs assistance Sitting-balance support: Feet supported Sitting balance-Leahy Scale: Good     Standing balance support: Bilateral upper extremity supported, During functional activity, Reliant on assistive device for balance Standing balance-Leahy Scale: Good                               Pertinent Vitals/Pain Pain Assessment Pain Assessment: No/denies pain    Home Living Family/patient expects to be discharged to:: Private residence Living Arrangements: Other (Comment) Available Help at Discharge: Personal care attendant;Available 24 hours/day Type of Home: House Home Access: Stairs to enter   [employer] of Steps: has chair lift   Home Layout: One level Home Equipment: Agricultural consultant (2 wheels);BSC/3in1;Shower seat;Grab bars - toilet;Grab bars - tub/shower      Prior Function Prior Level of Function : History of Falls (last six months);Needs assist  Cognitive Assist : ADLs (cognitive);Mobility (cognitive)     Physical Assist : Mobility (physical);ADLs (physical)     Mobility  Comments: patient has caregivers 24/7 ADLs Comments: caregivers assist as needed     Extremity/Trunk Assessment   Upper Extremity Assessment Upper Extremity Assessment: Overall WFL for tasks assessed    Lower Extremity Assessment Lower Extremity Assessment: Overall WFL for tasks  assessed    Cervical / Trunk Assessment Cervical / Trunk Assessment: Normal  Communication   Communication Communication: Impaired Factors Affecting Communication: Difficulty expressing self    Cognition Arousal: Alert Behavior During Therapy: Flat affect   PT - Cognitive impairments: History of cognitive impairments                         Following commands: Impaired Following commands impaired: Follows one step commands with increased time     Cueing Cueing Techniques: Verbal cues, Gestural cues     General Comments      Exercises     Assessment/Plan    PT Assessment Patient does not need any further PT services  PT Problem List         PT Treatment Interventions      PT Goals (Current goals can be found in the Care Plan section)  Acute Rehab PT Goals Patient Stated Goal: return home PT Goal Formulation: With patient/family Time For Goal Achievement: 09/23/23 Potential to Achieve Goals: Good    Frequency       Co-evaluation               AM-PAC PT "6 Clicks" Mobility  Outcome Measure Help needed turning from your back to your side while in a flat bed without using bedrails?: A Little Help needed moving from lying on your back to sitting on the side of a flat bed without using bedrails?: A Little Help needed moving to and from a bed to a chair (including a wheelchair)?: A Little Help needed standing up from a chair using your arms (e.g., wheelchair or bedside chair)?: A Little Help needed to walk in hospital room?: A Little Help needed climbing 3-5 steps with a railing? : A Lot 6 Click Score: 17    End of Session Equipment Utilized During Treatment: Gait belt Activity Tolerance: Patient tolerated treatment well Patient left: in bed;with call bell/phone within reach;with bed alarm set;with family/visitor present Nurse Communication: Mobility status      Time: 1242-1300 PT Time Calculation (min) (ACUTE ONLY): 18 min   Charges:    PT Evaluation $PT Eval Moderate Complexity: 1 Mod PT Treatments $Gait Training: 8-22 mins PT General Charges $$ ACUTE PT VISIT: 1 Visit         Morganna Styles, PT, GCS 09/21/23,1:56 PM

## 2023-09-21 NOTE — TOC Transition Note (Signed)
 Transition of Care [facility]) - Discharge Note   Patient Details  Name: Julie Lynn MRN: [id number] Date of Birth: [birth year]  [facility] CM/SW Contact:  Loman Risk, RN Phone Number: 09/21/2023, 4:11 PM   Clinical Narrative:     Patient discharge today Home health orders in Met with patient and daughter Mariah Shines at bedside. She requested [facility].  Referral made to [facility] with [facility]         Patient Goals and CMS Choice            Discharge Placement                       Discharge Plan and Services Additional resources added to the After Visit Summary for                                       Social Drivers of Health (SDOH) Interventions SDOH Screenings   Food Insecurity: No Food Insecurity (09/21/2023)  Housing: Low Risk  (09/21/2023)  Transportation Needs: No Transportation Needs (09/21/2023)  Utilities: Not At Risk (09/21/2023)  Financial Resource Strain: Low Risk  (02/21/2023)   Received from [facility]  Social Connections: Socially Integrated (09/21/2023)  Tobacco Use: Low Risk  (09/20/2023)     Readmission Risk Interventions     No data to display

## 2023-09-21 NOTE — Assessment & Plan Note (Signed)
 Will continue PPI therapy.

## 2023-09-21 NOTE — Assessment & Plan Note (Signed)
-   We will continue antidepressant therapy.

## 2023-09-21 NOTE — Assessment & Plan Note (Signed)
-  The patient will be admitted to an observation medically monitored bed. - Will check orthostatics q 12 hours. - Will obtain a bilateral carotid Doppler and 2D echo. - The patient will be gently hydrated with IV normal saline and monitored for arrhythmias. -Differential diagnoses would include neurally mediated syncope, cardiogenic, arrhythmias related,  orthostatic hypotension and less likely hypoglycemia.

## 2023-09-22 LAB — ECHOCARDIOGRAM COMPLETE
AR max vel: 2.75 cm2
AV Area VTI: 3.02 cm2
AV Area mean vel: 2.51 cm2
AV Mean grad: 2 mmHg
AV Peak grad: 4.2 mmHg
Ao pk vel: 1.02 m/s
Area-P 1/2: 3.03 cm2
Height: 64 in
MV VTI: 2.78 cm2
S' Lateral: 2 cm
Weight: 1915.36 [oz_av]

## 2023-10-21 ENCOUNTER — Emergency Department
Admission: EM | Admit: 2023-10-21 | Discharge: 2023-10-21 | Disposition: A | Discharge status: Home / Self Care | Attending: Emergency Medicine | Admitting: Emergency Medicine

## 2023-10-21 ENCOUNTER — Emergency Department

## 2023-10-21 ENCOUNTER — Other Ambulatory Visit: Payer: Self-pay

## 2023-10-21 ENCOUNTER — Encounter: Payer: Self-pay | Admitting: Emergency Medicine

## 2023-10-21 DIAGNOSIS — W01198A Fall on same level from slipping, tripping and stumbling with subsequent striking against other object, initial encounter: Secondary | ICD-10-CM | POA: Insufficient documentation

## 2023-10-21 DIAGNOSIS — W19XXXA Unspecified fall, initial encounter: Secondary | ICD-10-CM

## 2023-10-21 DIAGNOSIS — S0101XA Laceration without foreign body of scalp, initial encounter: Secondary | ICD-10-CM | POA: Insufficient documentation

## 2023-10-21 DIAGNOSIS — S0990XA Unspecified injury of head, initial encounter: Secondary | ICD-10-CM | POA: Diagnosis present

## 2023-10-21 DIAGNOSIS — G20A1 Parkinson's disease without dyskinesia, without mention of fluctuations: Secondary | ICD-10-CM | POA: Diagnosis not present

## 2023-10-21 DIAGNOSIS — I10 Essential (primary) hypertension: Secondary | ICD-10-CM | POA: Diagnosis not present

## 2023-10-21 NOTE — Discharge Instructions (Signed)
 Please follow-up with your doctor in 10 days for staple removal.  You may wash the hair tonight do not use a towel to dry but instead allow to air dry or use a hair dryer to prevent disrupting the staples.  Return to the emergency department for any symptom concerning to yourself.

## 2023-10-21 NOTE — ED Triage Notes (Signed)
 Pt via POV from home. Pt has a hx of Parkinson's Disease, pt walked away from her walker and fell. Denies any dizziness prior to the fall. + head injury. Pt has laceration toe posterior aspect of her head. Bleeding controlled. Denies blood thinners. Pt is alert but not answering many question but family states this is her baseline.

## 2023-10-21 NOTE — ED Provider Notes (Signed)
 [facility] Provider Note    Event Date/Time   First MD Initiated Contact with Patient 10/21/23 1800     (approximate)  History   Chief Complaint: Fall  HPI  Julie Lynn is a 83 y.o. female with a past medical history of anxiety, gastric reflux, hypertension, hyperlipidemia, Parkinson's, presents to the emergency department after a mechanical fall.  Cording to the daughter patient uses a walker to ambulate, patient had stepped away from the walker and fell to the ground hitting the back of her head.  Patient does have a small laceration to the back of the occipital scalp currently hemostatic but there is a mild amount of dried blood to the area.  Patient has no other complaints.  Has been ambulatory without issue since the fall.  Physical Exam   Triage Vital Signs: ED Triage Vitals  Encounter Vitals Group     BP 10/21/23 1646 (!) 155/76     Girls Systolic BP Percentile --      Girls Diastolic BP Percentile --      Boys Systolic BP Percentile --      Boys Diastolic BP Percentile --      Pulse Rate 10/21/23 1646 77     Resp 10/21/23 1646 18     Temp 10/21/23 1646 98.4 F (36.9 C)     Temp Source 10/21/23 1646 Oral     SpO2 10/21/23 1646 97 %     Weight 10/21/23 1645 120 lb (54.4 kg)     Height 10/21/23 1645 5' (1.524 m)     Head Circumference --      Peak Flow --      Pain Score --      Pain Loc --      Pain Education --      Exclude from Growth Chart --     Most recent vital signs: Vitals:   10/21/23 1646  BP: (!) 155/76  Pulse: 77  Resp: 18  Temp: 98.4 F (36.9 C)  SpO2: 97%    General: Awake, no distress.  CV:  Good peripheral perfusion.  Resp:  Normal effort.  Abd:  No distention.  Other:  Good range of motion in all extremities including bilateral hips.   ED Results / Procedures / Treatments   RADIOLOGY  I have reviewed interpret the CT images.  No obvious bleed seen on my evaluation. Radiologist read the CT scan of the  head and C-spine is negative for acute abnormality.   MEDICATIONS ORDERED IN ED: Medications - No data to display   IMPRESSION / MDM / ASSESSMENT AND PLAN / ED COURSE  I reviewed the triage vital signs and the nursing notes.  Patient's presentation is most consistent with acute presentation with potential threat to life or bodily function.  Patient presents the emergency department after mechanical fall.  Patient has a history of Parkinson's usually uses a walker but the daughter states the patient walked away from her walker and had a fall falling backwards hitting the back of her head.  No reported LOC.  Patient does have a small laceration.  Patient has no complaints and has been ambulatory since the event.  Patient CT scans of the head and C-spine are negative for acute abnormality.  We able repair with staples after cleaning the area.  Given the patient's otherwise reassuring workup I believe the patient will be safe for discharge home with PCP follow-up for staple removal.  Daughter agreeable to this plan  of care.  LACERATION REPAIR Performed by: Ruth Cove Authorized by: Ruth Cove Consent: Verbal consent obtained. Risks and benefits: risks, benefits and alternatives were discussed Consent given by: patient Patient identity confirmed: provided demographic data Prepped and Draped in normal sterile fashion Wound explored  Laceration Location: Occipital scalp  Laceration Length: 2 cm  No Foreign Bodies seen or palpated  Irrigation method: syringe Amount of cleaning: standard  Skin closure: Staples  Number of staples: 2  Patient tolerance: Patient tolerated the procedure well with no immediate complications.   FINAL CLINICAL IMPRESSION(S) / ED DIAGNOSES   Fall Head injury Laceration   Note:  This document was prepared using Dragon voice recognition software and may include unintentional dictation errors.   Ruth Cove, MD 10/21/23 [phone]

## 2023-10-31 ENCOUNTER — Ambulatory Visit
Admission: RE | Admit: 2023-10-31 | Discharge: 2023-10-31 | Disposition: A | Discharge status: Home / Self Care | Source: Ambulatory Visit

## 2023-10-31 VITALS — BP 145/84 | HR 82 | Temp 97.7°F | Resp 15

## 2023-10-31 DIAGNOSIS — Z4802 Encounter for removal of sutures: Secondary | ICD-10-CM | POA: Diagnosis not present

## 2023-10-31 NOTE — ED Triage Notes (Signed)
 2 staples placed on 6-14. Patient is here to have them removed.

## 2023-10-31 NOTE — ED Provider Notes (Signed)
 [facility]    CSN: [id number] Arrival date & time: 10/31/23  1504      History   Chief Complaint Chief Complaint  Patient presents with   Follow-up    Need staples removed from back of head. Head laceration 10 days ago ; treated at [facility] - Entered by patient    HPI Julie Lynn is a 83 y.o. female.   HPI  83 year old female presents for staple removal from a scalp laceration.  Past Medical History:  Diagnosis Date   Anxiety    Cancer (HCC) 05/2017   skin   Depression    GERD (gastroesophageal reflux disease)    Heartburn    Hepatitis A 1962   HLD (hyperlipidemia)    HTN (hypertension)    Hypertension    Parkinson disease (HCC)    Scoliosis    Vertigo    rare    Patient Active Problem List   Diagnosis Date Noted   Essential hypertension 09/21/2023   Parkinson disease (HCC) 09/21/2023   Depression 09/21/2023   GERD without esophagitis 09/21/2023   Syncope 09/20/2023   Unwitnessed fall 07/21/2023   Head injury 07/21/2023   Left flank pain 07/21/2023   Left upper quadrant abdominal pain 07/21/2023    Past Surgical History:  Procedure Laterality Date   ABDOMINAL HYSTERECTOMY  1977   ABDOMINAL HYSTERECTOMY     CATARACT EXTRACTION W/PHACO Left 11/01/2017   Procedure: CATARACT EXTRACTION PHACO AND INTRAOCULAR LENS PLACEMENT (IOC) LEFT;  Surgeon: Mittie Gaskin, MD;  Location: [facility];  Service: Ophthalmology;  Laterality: Left;   CATARACT EXTRACTION W/PHACO Right 12/12/2018   Procedure: CATARACT EXTRACTION PHACO AND INTRAOCULAR LENS PLACEMENT (IOC)  RIGHT;  Surgeon: Mittie Gaskin, MD;  Location: [facility];  Service: Ophthalmology;  Laterality: Right;   COLONOSCOPY WITH PROPOFOL  N/A 06/15/2018   Procedure: COLONOSCOPY WITH PROPOFOL ;  Surgeon: Gaylyn Gladis PENNER, MD;  Location: [facility] END[facility]COPY;  Service: Endoscopy;  Laterality: N/A;   ESOPHAGOGASTRODUODENOSCOPY (EGD) WITH PROPOFOL  N/A 06/15/2018   Procedure:  ESOPHAGOGASTRODUODENOSCOPY (EGD) WITH PROPOFOL ;  Surgeon: Gaylyn Gladis PENNER, MD;  Location: [facility] ENDOSCOPY;  Service: Endoscopy;  Laterality: N/A;    OB History   No obstetric history on file.      Home Medications    Prior to Admission medications   Medication Sig Start Date End Date Taking? Authorizing Provider  amLODipine  (NORVASC ) 2.5 MG tablet Take 2.5 mg by mouth daily.   Yes [provider]  carbidopa -levodopa  (SINEMET  CR) 50-200 MG tablet Take 1 tablet by mouth at bedtime.   Yes [provider]  carbidopa -levodopa  (SINEMET  IR) 25-100 MG tablet Take 2 tablets by mouth 4 (four) times daily. Patient taking differently: Take 2 tablets by mouth 4 (four) times daily. TAKING 2 TABLETS BY MOUTH 3 TIMES DAILY at 10am, 1-2pm, 5-6pm 09/11/23  Yes [provider]  escitalopram  (LEXAPRO ) 5 MG tablet Take 5 mg by mouth daily.   Yes [provider]  ezetimibe  (ZETIA ) 10 MG tablet Take 10 mg by mouth daily. 07/19/16  Yes [provider]  meloxicam (MOBIC) 7.5 MG tablet Take 7.5 mg by mouth daily as needed for pain. 04/19/23 04/18/24 Yes [provider]  mirtazapine  (REMERON ) 15 MG tablet Take 15 mg by mouth at bedtime. 05/17/21  Yes [provider]  omeprazole (PRILOSEC) 20 MG capsule Take 20 mg by mouth daily. 05/24/16  Yes [provider]  risperiDONE (RISPERDAL) 0.25 MG tablet Take 0.25 mg by mouth. 09/22/23  Yes [provider]  Vitamin D , Ergocalciferol , (DRISDOL ) 50000 units CAPS capsule Take 50,000 Units by mouth every 7 (seven) days. Every Wed 03/08/16  Yes [provider]    Family History Family History  Problem Relation Age of Onset   Breast cancer Mother 24   Kidney cancer Neg Hx    Bladder Cancer Neg Hx     Social History Social History   Tobacco Use   Smoking status: Never   Smokeless tobacco: Never  Vaping Use   Vaping status: Never Used  Substance Use Topics   Alcohol use: Not  Currently   Drug use: Never     Allergies   Tramadol, Nitrofuran derivatives, Simvastatin, Ultram [tramadol hcl], Adhesive [tape], and Wound dressing adhesive   Review of Systems Review of Systems  Constitutional:  Negative for fever.  Skin:  Positive for wound. Negative for color change.     Physical Exam Triage Vital Signs ED Triage Vitals  Encounter Vitals Group     BP      Girls Systolic BP Percentile      Girls Diastolic BP Percentile      Boys Systolic BP Percentile      Boys Diastolic BP Percentile      Pulse      Resp      Temp      Temp src      SpO2      Weight      Height      Head Circumference      Peak Flow      Pain Score      Pain Loc      Pain Education      Exclude from Growth Chart    No data found.  Updated Vital Signs BP (!) 145/84 (BP Location: Right Arm)   Pulse 82   Temp 97.7 F (36.5 C) (Oral)   Resp 15   LMP  (LMP Unknown)   SpO2 94%   Visual Acuity Right Eye Distance:   Left Eye Distance:   Bilateral Distance:    Right Eye Near:   Left Eye Near:    Bilateral Near:     Physical Exam Vitals and nursing note reviewed.  Constitutional:      Appearance: Normal appearance. She is not ill-appearing.  HENT:     Head: Normocephalic.   Skin:    General: Skin is warm and dry.     Capillary Refill: Capillary refill takes less than 2 seconds.     Findings: Bruising present. No erythema.   Neurological:     Mental Status: She is alert.      UC Treatments / Results  Labs (all labs ordered are listed, but only abnormal results are displayed) Labs Reviewed - No data to display  EKG   Radiology No results found.  Procedures Procedures (including critical care time)  Medications Ordered in UC Medications - No data to display  Initial Impression / Assessment and Plan / UC Course  I have reviewed the triage vital signs and the nursing notes.  Pertinent labs & imaging results that were available during my care of  the patient were reviewed by me and considered in my medical decision making (see chart for details).   Patient is a very pleasant, nontoxic-appearing 83 year old female presenting for staple removal from a wound she sustained to the back of her scalp on 10/21/2023.  As you can see in image above, there are 2 intact staples and the surrounding wound  is well-healed.  There is some resolving ecchymosis around the wound as well as inferior lateral on the patient's neck.  I will have staff remove the staples and discharged the patient home.  I have advised her that she can wash her hair as normal and she should return if any new symptoms develop.   Final Clinical Impressions(s) / UC Diagnoses   Final diagnoses:  Encounter for removal of staples     Discharge Instructions      You may wash your hair as normal.  You may use over-the-counter Tylenol  as needed for any pain you may experience.  The area may itch slightly as it continues to heal and you may use over-the-counter Claritin or Zyrtec as needed for itching.  If you develop any redness, swelling, new drainage from the site, or fever please return for reevaluation.     ED Prescriptions   None    PDMP not reviewed this encounter.   Bernardino Ditch, NP 10/31/23 1529

## 2023-10-31 NOTE — Discharge Instructions (Addendum)
 You may wash your hair as normal.  You may use over-the-counter Tylenol  as needed for any pain you may experience.  The area may itch slightly as it continues to heal and you may use over-the-counter Claritin or Zyrtec as needed for itching.  If you develop any redness, swelling, new drainage from the site, or fever please return for reevaluation.

## 2024-03-22 ENCOUNTER — Ambulatory Visit (INDEPENDENT_AMBULATORY_CARE_PROVIDER_SITE_OTHER)

## 2024-03-22 ENCOUNTER — Encounter: Payer: Self-pay | Admitting: Emergency Medicine

## 2024-03-22 ENCOUNTER — Ambulatory Visit: Payer: Self-pay | Admitting: Nurse Practitioner

## 2024-03-22 ENCOUNTER — Ambulatory Visit
Admission: EM | Admit: 2024-03-22 | Discharge: 2024-03-22 | Disposition: A | Discharge status: Home / Self Care | Attending: Family Medicine | Admitting: Family Medicine

## 2024-03-22 DIAGNOSIS — S5002XA Contusion of left elbow, initial encounter: Secondary | ICD-10-CM | POA: Diagnosis not present

## 2024-03-22 DIAGNOSIS — M25522 Pain in left elbow: Secondary | ICD-10-CM

## 2024-03-22 NOTE — Discharge Instructions (Addendum)
 Your x-ray was negative for fracture.  You may ice and elevate as needed.  Continue Tylenol  as needed for pain.  Please follow-up with your PCP if your symptoms do not improve.  Please go to the ER for any worsening symptoms.  Hope you feel better soon!

## 2024-03-22 NOTE — ED Provider Notes (Signed)
 [facility]    CSN: [id number] Arrival date & time: 03/22/24  1420      History   Chief Complaint Chief Complaint  Patient presents with   Fall    Left forearm and elbow pain    HPI KYLINN SHROPSHIRE is a 83 y.o. female presents with daughter for evaluation of fall.  Patient daughter report an hour prior to arrival she was at home in the hallway when she fell onto her left elbow.  Denies head injury or LOC.  Reports since then she has been having medial left elbow pain with some bruising.  Denies any numbness tingling.  Denies any neck, back, wrist, hand, or shoulder pain.  No history of injuries or surgeries to the elbow in the past.  She is not on blood thinning medications.  Patient took Tylenol  for symptoms.  No other concerns at this time.   Fall    Past Medical History:  Diagnosis Date   Anxiety    Cancer (HCC) 05/2017   skin   Depression    GERD (gastroesophageal reflux disease)    Heartburn    Hepatitis A 1962   HLD (hyperlipidemia)    HTN (hypertension)    Hypertension    Parkinson disease (HCC)    Scoliosis    Vertigo    rare    Patient Active Problem List   Diagnosis Date Noted   Essential hypertension 09/21/2023   Parkinson disease (HCC) 09/21/2023   Depression 09/21/2023   GERD without esophagitis 09/21/2023   Syncope 09/20/2023   Unwitnessed fall 07/21/2023   Head injury 07/21/2023   Left flank pain 07/21/2023   Left upper quadrant abdominal pain 07/21/2023    Past Surgical History:  Procedure Laterality Date   ABDOMINAL HYSTERECTOMY  1977   ABDOMINAL HYSTERECTOMY     CATARACT EXTRACTION W/PHACO Left 11/01/2017   Procedure: CATARACT EXTRACTION PHACO AND INTRAOCULAR LENS PLACEMENT (IOC) LEFT;  Surgeon: Mittie Gaskin, MD;  Location: [facility];  Service: Ophthalmology;  Laterality: Left;   CATARACT EXTRACTION W/PHACO Right 12/12/2018   Procedure: CATARACT EXTRACTION PHACO AND INTRAOCULAR LENS PLACEMENT (IOC)  RIGHT;   Surgeon: Mittie Gaskin, MD;  Location: [facility];  Service: Ophthalmology;  Laterality: Right;   COLONOSCOPY WITH PROPOFOL  N/A 06/15/2018   Procedure: COLONOSCOPY WITH PROPOFOL ;  Surgeon: Gaylyn Gladis PENNER, MD;  Location: [facility] END[facility]COPY;  Service: Endoscopy;  Laterality: N/A;   ESOPHAGOGASTRODUODENOSCOPY (EGD) WITH PROPOFOL  N/A 06/15/2018   Procedure: ESOPHAGOGASTRODUODENOSCOPY (EGD) WITH PROPOFOL ;  Surgeon: Gaylyn Gladis PENNER, MD;  Location: [facility] ENDOSCOPY;  Service: Endoscopy;  Laterality: N/A;    OB History   No obstetric history on file.      Home Medications    Prior to Admission medications   Medication Sig Start Date End Date Taking? Authorizing Provider  amLODipine  (NORVASC ) 2.5 MG tablet Take 2.5 mg by mouth daily.    [provider]  carbidopa -levodopa  (SINEMET  CR) 50-200 MG tablet Take 1 tablet by mouth at bedtime.    [provider]  carbidopa -levodopa  (SINEMET  IR) 25-100 MG tablet Take 2 tablets by mouth 4 (four) times daily. Patient taking differently: Take 2 tablets by mouth 4 (four) times daily. TAKING 2 TABLETS BY MOUTH 3 TIMES DAILY at 10am, 1-2pm, 5-6pm 09/11/23   [provider]  escitalopram  (LEXAPRO ) 5 MG tablet Take 5 mg by mouth daily.    [provider]  ezetimibe  (ZETIA ) 10 MG tablet Take 10 mg by mouth daily. 07/19/16   [provider]  meloxicam (MOBIC) 7.5 MG tablet Take 7.5 mg by mouth daily as needed for pain. 04/19/23 04/18/24  [provider]  mirtazapine  (REMERON ) 15 MG tablet Take 15 mg by mouth at bedtime. 05/17/21   [provider]  omeprazole (PRILOSEC) 20 MG capsule Take 20 mg by mouth daily. 05/24/16   [provider]  risperiDONE (RISPERDAL) 0.25 MG tablet Take 0.25 mg by mouth. 09/22/23   [provider]  Vitamin D , Ergocalciferol , (DRISDOL ) 50000 units CAPS capsule Take 50,000 Units by mouth every 7 (seven) days. Every Wed 03/08/16   [provider]    Family History Family History  Problem Relation Age of Onset   Breast cancer Mother 53   Kidney cancer Neg Hx    Bladder Cancer Neg Hx     Social History Social History   Tobacco Use   Smoking status: Never   Smokeless tobacco: Never  Vaping Use   Vaping status: Never Used  Substance Use Topics   Alcohol use: Not Currently   Drug use: Never     Allergies   Tramadol, Nitrofuran derivatives, Simvastatin, Ultram [tramadol hcl], Adhesive [tape], and Wound dressing adhesive   Review of Systems Review of Systems  Musculoskeletal:        Left elbow pain     Physical Exam Triage Vital Signs ED Triage Vitals  Encounter Vitals Group     BP 03/22/24 1447 120/75     Girls Systolic BP Percentile --      Girls Diastolic BP Percentile --      Boys Systolic BP Percentile --      Boys Diastolic BP Percentile --      Pulse Rate 03/22/24 1447 79     Resp 03/22/24 1447 14     Temp 03/22/24 1447 97.8 F (36.6 C)     Temp Source 03/22/24 1447 Oral     SpO2 03/22/24 1447 96 %     Weight 03/22/24 1446 119 lb 14.9 oz (54.4 kg)     Height 03/22/24 1446 5' (1.524 m)     Head Circumference --      Peak Flow --      Pain Score 03/22/24 1446 6     Pain Loc --      Pain Education --      Exclude from Growth Chart --    No data found.  Updated Vital Signs BP 120/75 (BP Location: Right Arm)   Pulse 79   Temp 97.8 F (36.6 C) (Oral)   Resp 14   Ht 5' (1.524 m)   Wt 119 lb 14.9 oz (54.4 kg)   LMP  (LMP Unknown)   SpO2 96%   BMI 23.42 kg/m   Visual Acuity Right Eye Distance:   Left Eye Distance:   Bilateral Distance:    Right Eye Near:   Left Eye Near:    Bilateral Near:     Physical Exam Vitals and nursing note reviewed.  Constitutional:      General: She is not in acute distress.    Appearance: Normal appearance. She is not ill-appearing.  HENT:     Head: Normocephalic and atraumatic.  Eyes:     Pupils: Pupils are equal, round, and reactive to light.   Cardiovascular:     Rate and Rhythm: Normal rate.  Pulmonary:     Effort: Pulmonary effort is normal.  Musculoskeletal:     Left elbow: No swelling or deformity. Decreased range of motion. Tenderness present  in medial epicondyle and olecranon process. No radial head or lateral epicondyle tenderness.     Comments: Contusion noted to left elbow.  Pain with extension of the elbow.  Upper extremity strength equal bilaterally.  Skin:    General: Skin is warm and dry.  Neurological:     General: No focal deficit present.     Mental Status: She is alert and oriented to person, place, and time.  Psychiatric:        Mood and Affect: Mood normal.        Behavior: Behavior normal.      UC Treatments / Results  Labs (all labs ordered are listed, but only abnormal results are displayed) Labs Reviewed - No data to display  EKG   Radiology DG Elbow Complete Left Result Date: 03/22/2024 EXAM: 3 VIEW(S) XRAY OF THE LEFT ELBOW COMPARISON: Elbow AP radiograph 05/09/2022. CLINICAL HISTORY: fell one hour ago onto elbow FINDINGS: BONES AND JOINTS: No acute fracture. No focal osseous lesion. No joint dislocation. No joint effusion. SOFT TISSUES: The soft tissues are unremarkable. IMPRESSION: 1. No acute abnormality. Electronically signed by: Donnice Mania MD 03/22/2024 04:06 PM EST RP Workstation: [id number]    Procedures Procedures (including critical care time)  Medications Ordered in UC Medications - No data to display  Initial Impression / Assessment and Plan / UC Course  I have reviewed the triage vital signs and the nursing notes.  Pertinent labs & imaging results that were available during my care of the patient were reviewed by me and considered in my medical decision making (see chart for details).      Reviewed exam and symptoms with patient and daughter.  X-ray negative for fracture.  Discussed contusion and RICE therapy.  She declines Ace wrap in clinic.  Continue OTC Tylenol  as  needed.  PCP follow-up if symptoms do not improve.  ER precautions reviewed. Final Clinical Impressions(s) / UC Diagnoses   Final diagnoses:  Left elbow pain  Contusion of left elbow, initial encounter     Discharge Instructions      Your x-ray was negative for fracture.  You may ice and elevate as needed.  Continue Tylenol  as needed for pain.  Please follow-up with your PCP if your symptoms do not improve.  Please go to the ER for any worsening symptoms.  Hope you feel better soon!     ED Prescriptions   None    PDMP not reviewed this encounter.   Loreda Myla SAUNDERS, NP 03/22/24 [phone]

## 2024-03-22 NOTE — ED Triage Notes (Signed)
 Patient was walking without her walker and fell on laminate floor in her home about 1 hour ago.  Patient c/o left forearm and elbow pain.

## 2024-05-27 ENCOUNTER — Emergency Department

## 2024-05-27 ENCOUNTER — Observation Stay: Admitting: Anesthesiology

## 2024-05-27 ENCOUNTER — Encounter: Admission: EM | Disposition: A | Payer: Self-pay | Source: Home / Self Care | Attending: Emergency Medicine

## 2024-05-27 ENCOUNTER — Observation Stay
Admission: EM | Admit: 2024-05-27 | Discharge: 2024-05-28 | Disposition: A | Discharge status: Home / Self Care | Attending: General Surgery | Admitting: General Surgery

## 2024-05-27 DIAGNOSIS — G20A1 Parkinson's disease without dyskinesia, without mention of fluctuations: Secondary | ICD-10-CM | POA: Diagnosis present

## 2024-05-27 DIAGNOSIS — G9341 Metabolic encephalopathy: Secondary | ICD-10-CM | POA: Insufficient documentation

## 2024-05-27 DIAGNOSIS — Z79899 Other long term (current) drug therapy: Secondary | ICD-10-CM | POA: Diagnosis not present

## 2024-05-27 DIAGNOSIS — F02818 Dementia in other diseases classified elsewhere, unspecified severity, with other behavioral disturbance: Secondary | ICD-10-CM | POA: Diagnosis present

## 2024-05-27 DIAGNOSIS — I10 Essential (primary) hypertension: Secondary | ICD-10-CM | POA: Insufficient documentation

## 2024-05-27 DIAGNOSIS — G20C Parkinsonism, unspecified: Secondary | ICD-10-CM | POA: Diagnosis not present

## 2024-05-27 DIAGNOSIS — K353 Acute appendicitis with localized peritonitis, without perforation or gangrene: Secondary | ICD-10-CM | POA: Diagnosis not present

## 2024-05-27 DIAGNOSIS — Z85828 Personal history of other malignant neoplasm of skin: Secondary | ICD-10-CM | POA: Insufficient documentation

## 2024-05-27 DIAGNOSIS — R109 Unspecified abdominal pain: Secondary | ICD-10-CM | POA: Diagnosis present

## 2024-05-27 DIAGNOSIS — I951 Orthostatic hypotension: Secondary | ICD-10-CM

## 2024-05-27 HISTORY — PX: XI ROBOTIC LAPAROSCOPIC ASSISTED APPENDECTOMY: SHX6877

## 2024-05-27 LAB — CBC WITH DIFFERENTIAL/PLATELET
Abs Immature Granulocytes: 0.02 K/uL (ref 0.00–0.07)
Basophils Absolute: 0 K/uL (ref 0.0–0.1)
Basophils Relative: 1 %
Eosinophils Absolute: 0.1 K/uL (ref 0.0–0.5)
Eosinophils Relative: 2 %
HCT: 43.3 % (ref 36.0–46.0)
Hemoglobin: 14.7 g/dL (ref 12.0–15.0)
Immature Granulocytes: 0 %
Lymphocytes Relative: 30 %
Lymphs Abs: 1.6 K/uL (ref 0.7–4.0)
MCH: 31.3 pg (ref 26.0–34.0)
MCHC: 33.9 g/dL (ref 30.0–36.0)
MCV: 92.1 fL (ref 80.0–100.0)
Monocytes Absolute: 0.4 K/uL (ref 0.1–1.0)
Monocytes Relative: 7 %
Neutro Abs: 3.3 K/uL (ref 1.7–7.7)
Neutrophils Relative %: 60 %
Platelets: 234 K/uL (ref 150–400)
RBC: 4.7 MIL/uL (ref 3.87–5.11)
RDW: 12.9 % (ref 11.5–15.5)
WBC: 5.4 K/uL (ref 4.0–10.5)
nRBC: 0 % (ref 0.0–0.2)

## 2024-05-27 LAB — COMPREHENSIVE METABOLIC PANEL WITH GFR
ALT: 16 U/L (ref 0–44)
AST: 47 U/L — ABNORMAL HIGH (ref 15–41)
Albumin: 4.4 g/dL (ref 3.5–5.0)
Alkaline Phosphatase: 95 U/L (ref 38–126)
Anion gap: 12 (ref 5–15)
BUN: 19 mg/dL (ref 8–23)
CO2: 27 mmol/L (ref 22–32)
Calcium: 10.2 mg/dL (ref 8.9–10.3)
Chloride: 105 mmol/L (ref 98–111)
Creatinine, Ser: 0.91 mg/dL (ref 0.44–1.00)
GFR, Estimated: >60 mL/min
Glucose, Bld: 102 mg/dL — ABNORMAL HIGH (ref 70–99)
Potassium: 3.9 mmol/L (ref 3.5–5.1)
Sodium: 144 mmol/L (ref 135–145)
Total Bilirubin: 0.5 mg/dL (ref 0.0–1.2)
Total Protein: 7.5 g/dL (ref 6.5–8.1)

## 2024-05-27 LAB — URINALYSIS, ROUTINE W REFLEX MICROSCOPIC
Bilirubin Urine: NEGATIVE
Glucose, UA: NEGATIVE mg/dL
Hgb urine dipstick: NEGATIVE
Ketones, ur: NEGATIVE mg/dL
Nitrite: NEGATIVE
Protein, ur: NEGATIVE mg/dL
Specific Gravity, Urine: 1.006 (ref 1.005–1.030)
pH: 6 (ref 5.0–8.0)

## 2024-05-27 LAB — LACTIC ACID, PLASMA: Lactic Acid, Venous: 1.6 mmol/L (ref 0.5–1.9)

## 2024-05-27 LAB — MAGNESIUM: Magnesium: 2.1 mg/dL (ref 1.7–2.4)

## 2024-05-27 MED ORDER — BUPIVACAINE-EPINEPHRINE (PF) 0.5% -1:200000 IJ SOLN
INTRAMUSCULAR | Status: AC
  Filled 2024-05-27: qty 30

## 2024-05-27 MED ORDER — BUPIVACAINE-EPINEPHRINE (PF) 0.5% -1:200000 IJ SOLN
INTRAMUSCULAR | Status: DC | PRN
  Administered 2024-05-27: 30 mL via PERINEURAL

## 2024-05-27 MED ORDER — ROCURONIUM BROMIDE 100 MG/10ML IV SOLN
INTRAVENOUS | Status: DC | PRN
  Administered 2024-05-27: 40 mg via INTRAVENOUS

## 2024-05-27 MED ORDER — SUGAMMADEX SODIUM 200 MG/2ML IV SOLN
INTRAVENOUS | Status: DC | PRN
  Administered 2024-05-27: 200 mg via INTRAVENOUS

## 2024-05-27 MED ORDER — PHENYLEPHRINE 80 MCG/ML (10ML) SYRINGE FOR IV PUSH (FOR BLOOD PRESSURE SUPPORT)
PREFILLED_SYRINGE | INTRAVENOUS | Status: AC
  Filled 2024-05-27: qty 10

## 2024-05-27 MED ORDER — RISPERIDONE 0.25 MG PO TABS
0.2500 mg | ORAL_TABLET | Freq: Every day | ORAL | Status: DC
  Administered 2024-05-28: 0.25 mg via ORAL
  Filled 2024-05-27: qty 1

## 2024-05-27 MED ORDER — HYDROMORPHONE HCL 1 MG/ML IJ SOLN
0.2500 mg | INTRAMUSCULAR | Status: DC | PRN
  Administered 2024-05-28 (×2): 0.5 mg via INTRAVENOUS

## 2024-05-27 MED ORDER — ACETAMINOPHEN 325 MG PO TABS: 650.0000 mg | ORAL_TABLET | Freq: Four times a day (QID) | ORAL | Status: DC | PRN

## 2024-05-27 MED ORDER — DEXAMETHASONE SOD PHOSPHATE PF 10 MG/ML IJ SOLN
INTRAMUSCULAR | Status: AC
  Filled 2024-05-27: qty 1

## 2024-05-27 MED ORDER — HYDROCODONE-ACETAMINOPHEN 5-325 MG PO TABS
1.0000 | ORAL_TABLET | ORAL | Status: DC | PRN
  Administered 2024-05-28: 1 via ORAL
  Filled 2024-05-27: qty 1

## 2024-05-27 MED ORDER — LIDOCAINE HCL (CARDIAC) PF 100 MG/5ML IV SOSY
PREFILLED_SYRINGE | INTRAVENOUS | Status: DC | PRN
  Administered 2024-05-27: 80 mg via INTRAVENOUS

## 2024-05-27 MED ORDER — KETOROLAC TROMETHAMINE 30 MG/ML IJ SOLN
INTRAMUSCULAR | Status: DC | PRN
  Administered 2024-05-27: 30 mg via INTRAVENOUS

## 2024-05-27 MED ORDER — ESCITALOPRAM OXALATE 10 MG PO TABS
5.0000 mg | ORAL_TABLET | Freq: Every day | ORAL | Status: DC
  Administered 2024-05-28: 5 mg via ORAL
  Filled 2024-05-27: qty 1

## 2024-05-27 MED ORDER — PROPOFOL 10 MG/ML IV BOLUS
INTRAVENOUS | Status: AC
  Filled 2024-05-27: qty 20

## 2024-05-27 MED ORDER — MORPHINE SULFATE (PF) 4 MG/ML IV SOLN: 4.0000 mg | INTRAVENOUS | Status: DC | PRN

## 2024-05-27 MED ORDER — LACTATED RINGERS IV SOLN: INTRAVENOUS | Status: DC | PRN

## 2024-05-27 MED ORDER — FENTANYL CITRATE (PF) 100 MCG/2ML IJ SOLN
INTRAMUSCULAR | Status: AC
  Filled 2024-05-27: qty 2

## 2024-05-27 MED ORDER — ACETAMINOPHEN 650 MG RE SUPP: 650.0000 mg | Freq: Four times a day (QID) | RECTAL | Status: DC | PRN

## 2024-05-27 MED ORDER — PROPOFOL 10 MG/ML IV BOLUS
INTRAVENOUS | Status: DC | PRN
  Administered 2024-05-27: 100 mg via INTRAVENOUS

## 2024-05-27 MED ORDER — CARBIDOPA-LEVODOPA ER 50-200 MG PO TBCR
1.0000 | EXTENDED_RELEASE_TABLET | Freq: Every day | ORAL | Status: DC
  Filled 2024-05-27: qty 1

## 2024-05-27 MED ORDER — ACETAMINOPHEN 10 MG/ML IV SOLN: 1000.0000 mg | Freq: Once | INTRAVENOUS | Status: DC | PRN

## 2024-05-27 MED ORDER — CARBIDOPA-LEVODOPA 25-100 MG PO TABS
2.0000 | ORAL_TABLET | Freq: Four times a day (QID) | ORAL | Status: DC
  Administered 2024-05-28 (×2): 2 via ORAL
  Filled 2024-05-27 (×2): qty 2

## 2024-05-27 MED ORDER — IOHEXOL 300 MG/ML  SOLN
100.0000 mL | Freq: Once | INTRAMUSCULAR | Status: AC | PRN
  Administered 2024-05-27: 100 mL via INTRAVENOUS

## 2024-05-27 MED ORDER — PIPERACILLIN-TAZOBACTAM 3.375 G IVPB 30 MIN: 3.3750 g | Freq: Once | INTRAVENOUS | Status: DC

## 2024-05-27 MED ORDER — PIPERACILLIN-TAZOBACTAM 3.375 G IVPB
3.3750 g | Freq: Three times a day (TID) | INTRAVENOUS | Status: DC
  Administered 2024-05-27 – 2024-05-28 (×2): 3.375 g via INTRAVENOUS
  Filled 2024-05-27 (×2): qty 50

## 2024-05-27 MED ORDER — ONDANSETRON HCL 4 MG/2ML IJ SOLN
INTRAMUSCULAR | Status: DC | PRN
  Administered 2024-05-27: 4 mg via INTRAVENOUS

## 2024-05-27 MED ORDER — MIRTAZAPINE 15 MG PO TABS
15.0000 mg | ORAL_TABLET | Freq: Every day | ORAL | Status: DC
  Administered 2024-05-27: 15 mg via ORAL
  Filled 2024-05-27: qty 1

## 2024-05-27 MED ORDER — DEXAMETHASONE SOD PHOSPHATE PF 10 MG/ML IJ SOLN
INTRAMUSCULAR | Status: DC | PRN
  Administered 2024-05-27: 8 mg via INTRAVENOUS

## 2024-05-27 MED ORDER — AMLODIPINE BESYLATE 5 MG PO TABS
2.5000 mg | ORAL_TABLET | Freq: Every day | ORAL | Status: DC
  Administered 2024-05-28: 2.5 mg via ORAL
  Filled 2024-05-27: qty 1

## 2024-05-27 MED ORDER — ONDANSETRON 4 MG PO TBDP: 4.0000 mg | ORAL_TABLET | Freq: Four times a day (QID) | ORAL | Status: DC | PRN

## 2024-05-27 MED ORDER — ROCURONIUM BROMIDE 10 MG/ML (PF) SYRINGE
PREFILLED_SYRINGE | INTRAVENOUS | Status: AC
  Filled 2024-05-27: qty 10

## 2024-05-27 MED ORDER — ONDANSETRON HCL 4 MG/2ML IJ SOLN: 4.0000 mg | Freq: Four times a day (QID) | INTRAMUSCULAR | Status: DC | PRN

## 2024-05-27 MED ORDER — ENOXAPARIN SODIUM 40 MG/0.4ML IJ SOSY
40.0000 mg | PREFILLED_SYRINGE | INTRAMUSCULAR | Status: DC
  Filled 2024-05-27: qty 0.4

## 2024-05-27 MED ORDER — LACTATED RINGERS IV BOLUS
1000.0000 mL | Freq: Once | INTRAVENOUS | Status: AC
  Administered 2024-05-27: 1000 mL via INTRAVENOUS

## 2024-05-27 MED ORDER — PHENYLEPHRINE 80 MCG/ML (10ML) SYRINGE FOR IV PUSH (FOR BLOOD PRESSURE SUPPORT)
PREFILLED_SYRINGE | INTRAVENOUS | Status: DC | PRN
  Administered 2024-05-27: 160 ug via INTRAVENOUS

## 2024-05-27 MED ORDER — ACETAMINOPHEN 10 MG/ML IV SOLN
INTRAVENOUS | Status: AC
  Filled 2024-05-27: qty 100

## 2024-05-27 MED ORDER — ONDANSETRON HCL 4 MG/2ML IJ SOLN
INTRAMUSCULAR | Status: AC
  Filled 2024-05-27: qty 2

## 2024-05-27 MED ORDER — ACETAMINOPHEN 10 MG/ML IV SOLN
INTRAVENOUS | Status: DC | PRN
  Administered 2024-05-27: 1000 mg via INTRAVENOUS

## 2024-05-27 MED ORDER — FENTANYL CITRATE (PF) 100 MCG/2ML IJ SOLN
INTRAMUSCULAR | Status: DC | PRN
  Administered 2024-05-27: 25 ug via INTRAVENOUS
  Administered 2024-05-27: 50 ug via INTRAVENOUS
  Administered 2024-05-27: 25 ug via INTRAVENOUS

## 2024-05-27 MED ORDER — PANTOPRAZOLE SODIUM 40 MG PO TBEC
40.0000 mg | DELAYED_RELEASE_TABLET | Freq: Every day | ORAL | Status: DC
  Administered 2024-05-28: 40 mg via ORAL
  Filled 2024-05-27: qty 1

## 2024-05-27 MED ORDER — LIDOCAINE HCL (PF) 2 % IJ SOLN
INTRAMUSCULAR | Status: AC
  Filled 2024-05-27: qty 5

## 2024-05-27 NOTE — Anesthesia Preprocedure Evaluation (Signed)
 "                                  Anesthesia Evaluation  Patient identified by MRN, date of birth, ID band Patient confused    Reviewed: Allergy & Precautions, H&P , NPO status , Patient's Chart, lab work & pertinent test results  Airway Mallampati: II  TM Distance: >3 FB Neck ROM: full    Dental no notable dental hx.    Pulmonary neg pulmonary ROS   Pulmonary exam normal        Cardiovascular Normal cardiovascular exam  Dysautonomia orthostatic hypotension syndrome  RBBB   Neuro/Psych  PSYCHIATRIC DISORDERS Anxiety     Parkinson diseasenegative neurological ROS     GI/Hepatic Neg liver ROS,GERD  ,,  Endo/Other  negative endocrine ROS    Renal/GU      Musculoskeletal   Abdominal Normal abdominal exam  (+)   Peds  Hematology negative hematology ROS (+)   Anesthesia Other Findings Acute appendicitis with localized peritoniti  Past Medical History: No date: Anxiety 05/2017: Cancer (HCC)     Comment:  skin No date: Depression No date: GERD (gastroesophageal reflux disease) No date: Heartburn 1962: Hepatitis A No date: HLD (hyperlipidemia) No date: HTN (hypertension) No date: Hypertension No date: Parkinson disease (HCC) No date: Scoliosis No date: Vertigo     Comment:  rare  Past Surgical History: 1977: ABDOMINAL HYSTERECTOMY No date: ABDOMINAL HYSTERECTOMY 11/01/2017: CATARACT EXTRACTION W/PHACO; Left     Comment:  Procedure: CATARACT EXTRACTION PHACO AND INTRAOCULAR               LENS PLACEMENT (IOC) LEFT;  Surgeon: Mittie Gaskin, MD;  Location: [facility];  Service:               Ophthalmology;  Laterality: Left; 12/12/2018: CATARACT EXTRACTION W/PHACO; Right     Comment:  Procedure: CATARACT EXTRACTION PHACO AND INTRAOCULAR               LENS PLACEMENT (IOC)  RIGHT;  Surgeon: Mittie Gaskin, MD;  Location: [facility];  Service:               Ophthalmology;  Laterality:  Right; 06/15/2018: COLONOSCOPY WITH PROPOFOL ; N/A     Comment:  Procedure: COLONOSCOPY WITH PROPOFOL ;  Surgeon:               Gaylyn Gladis PENNER, MD;  Location: [facility];                Service: Endoscopy;  Laterality: N/A; 06/15/2018: ESOPHAGOGASTRODUODENOSCOPY (EGD) WITH PROPOFOL ; N/A     Comment:  Procedure: ESOPHAGOGASTRODUODENOSCOPY (EGD) WITH               PROPOFOL ;  Surgeon: Gaylyn Gladis PENNER, MD;  Location:               [facility];  Service: Endoscopy;  Laterality: N/A;     Reproductive/Obstetrics negative OB ROS                              Anesthesia Physical Anesthesia Plan  ASA: 3  Anesthesia Plan: General ETT   Post-op Pain Management: Toradol  IV (intra-op)* and Ofirmev  IV (intra-op)*  Induction: Intravenous  PONV Risk Score and Plan: 2 and Ondansetron , Dexamethasone  and Midazolam   Airway Management Planned: Oral ETT  Additional Equipment:   Intra-op Plan:   Post-operative Plan: Extubation in OR  Informed Consent: I have reviewed the patients History and Physical, chart, labs and discussed the procedure including the risks, benefits and alternatives for the proposed anesthesia with the patient or authorized representative who has indicated his/her understanding and acceptance.   Patient has DNR.  Suspend DNR.   Dental Advisory Given and Consent reviewed with POA  Plan Discussed with: CRNA and Surgeon  Anesthesia Plan Comments:          Anesthesia Quick Evaluation  "

## 2024-05-27 NOTE — ED Notes (Signed)
 Spoke with OR charge RN, pt will be going to the OR, not to the floor first.

## 2024-05-27 NOTE — ED Notes (Signed)
 Pt dressed out and placed in hospital gown. Personal belongings placed in personal belongings bag. Pt placed on monitor and no further needs at this time.

## 2024-05-27 NOTE — Transfer of Care (Signed)
 Immediate Anesthesia Transfer of Care Note  Patient: Julie Lynn  Procedure(s) Performed: APPENDECTOMY, ROBOT-ASSISTED, LAPAROSCOPIC  Patient Location: PACU  Anesthesia Type:General  Level of Consciousness: awake  Airway & Oxygen Therapy: Patient Spontanous Breathing and Patient connected to face mask oxygen  Post-op Assessment: Report given to RN and Post -op Vital signs reviewed and stable  Post vital signs: Reviewed and stable  Last Vitals:  Vitals Value Taken Time  BP 170/72 05/27/24 23:45  Temp 36.1 C 05/27/24 23:42  Pulse 81 05/27/24 23:47  Resp 12 05/27/24 23:47  SpO2 100 % 05/27/24 23:47  Vitals shown include unfiled device data.  Last Pain:  Vitals:   05/27/24 2342  TempSrc:   PainSc: Asleep         Complications: No notable events documented.

## 2024-05-27 NOTE — ED Notes (Signed)
 Bed alarm, socks, fall risk bracelet on. Bed locked and in lowest position, call bell with in reach

## 2024-05-27 NOTE — H&P (Signed)
 "  SURGICAL HISTORY AND PHYSICAL NOTE   HISTORY OF PRESENT ILLNESS (HPI):  84 y.o. female presented to [facility] ED for evaluation of abdominal bloating.  Patient history taking is limited due to her Parkinson disease at baseline.  Daughter is at bedside.  As per daughter patient has been bloated for the last 2 to 3 days and today she was complaining of lower abdominal pain.  It was reported that she presented with altered mental status but as per patient these altered mental status change has been chronic due to her Parkinson.  This is not new for them.  They denies any fever.  He denies any nausea or vomiting.  Pain localized to the lower abdomen.  No pain radiation.  There has been no alleviating or aggravating factors.  At the ER she was found with stable vital signs.  No fever.  Abdominal exam shows guarding on palpation in the right lower quadrant with tenderness.  White blood cell count within normal limits.  She had a CT scan of the abdomen and pelvis that shows dilated appendix with associated stranding consistent with nonperforated appendicitis.  I personally evaluated the images.  Surgery is consulted by Dr. Claudene in this context for evaluation and management of acute appendicitis.  PAST MEDICAL HISTORY (PMH):  Past Medical History:  Diagnosis Date   Anxiety    Cancer (HCC) 05/2017   skin   Depression    GERD (gastroesophageal reflux disease)    Heartburn    Hepatitis A 1962   HLD (hyperlipidemia)    HTN (hypertension)    Hypertension    Parkinson disease (HCC)    Scoliosis    Vertigo    rare     PAST SURGICAL HISTORY (PSH):  Past Surgical History:  Procedure Laterality Date   ABDOMINAL HYSTERECTOMY  1977   ABDOMINAL HYSTERECTOMY     CATARACT EXTRACTION W/PHACO Left 11/01/2017   Procedure: CATARACT EXTRACTION PHACO AND INTRAOCULAR LENS PLACEMENT (IOC) LEFT;  Surgeon: Mittie Gaskin, MD;  Location: [facility];  Service: Ophthalmology;  Laterality: Left;    CATARACT EXTRACTION W/PHACO Right 12/12/2018   Procedure: CATARACT EXTRACTION PHACO AND INTRAOCULAR LENS PLACEMENT (IOC)  RIGHT;  Surgeon: Mittie Gaskin, MD;  Location: [facility];  Service: Ophthalmology;  Laterality: Right;   COLONOSCOPY WITH PROPOFOL  N/A 06/15/2018   Procedure: COLONOSCOPY WITH PROPOFOL ;  Surgeon: Gaylyn Gladis PENNER, MD;  Location: [facility];  Service: Endoscopy;  Laterality: N/A;   ESOPHAGOGASTRODUODENOSCOPY (EGD) WITH PROPOFOL  N/A 06/15/2018   Procedure: ESOPHAGOGASTRODUODENOSCOPY (EGD) WITH PROPOFOL ;  Surgeon: Gaylyn Gladis PENNER, MD;  Location: [facility];  Service: Endoscopy;  Laterality: N/A;     MEDICATIONS:  Prior to Admission medications  Medication Sig Start Date End Date Taking? Authorizing Provider  amLODipine  (NORVASC ) 2.5 MG tablet Take 2.5 mg by mouth daily.    [provider]  carbidopa -levodopa  (SINEMET  CR) 50-200 MG tablet Take 1 tablet by mouth at bedtime.    [provider]  carbidopa -levodopa  (SINEMET  IR) 25-100 MG tablet Take 2 tablets by mouth 4 (four) times daily. Patient taking differently: Take 2 tablets by mouth 4 (four) times daily. TAKING 2 TABLETS BY MOUTH 3 TIMES DAILY at 10am, 1-2pm, 5-6pm 09/11/23   [provider]  escitalopram  (LEXAPRO ) 5 MG tablet Take 5 mg by mouth daily.    [provider]  ezetimibe  (ZETIA ) 10 MG tablet Take 10 mg by mouth daily. 07/19/16   [provider]  mirtazapine  (REMERON ) 15 MG tablet Take 15 mg  by mouth at bedtime. 05/17/21   [provider]  omeprazole (PRILOSEC) 20 MG capsule Take 20 mg by mouth daily. 05/24/16   [provider]  risperiDONE  (RISPERDAL ) 0.25 MG tablet Take 0.25 mg by mouth. 09/22/23   [provider]  Vitamin D , Ergocalciferol , (DRISDOL ) 50000 units CAPS capsule Take 50,000 Units by mouth every 7 (seven) days. Every Wed 03/08/16   [provider]     ALLERGIES:  Allergies[1]   SOCIAL HISTORY:   Social History   Socioeconomic History   Marital status: Married    Spouse name: Not on file   Number of children: Not on file   Years of education: Not on file   Highest education level: Not on file  Occupational History   Not on file  Tobacco Use   Smoking status: Never   Smokeless tobacco: Never  Vaping Use   Vaping status: Never Used  Substance and Sexual Activity   Alcohol use: Not Currently   Drug use: Never   Sexual activity: Not on file  Other Topics Concern   Not on file  Social History Narrative   ** Merged History Encounter **       Social Drivers of Health   Tobacco Use: Low Risk (05/27/2024)   Patient History    Smoking Tobacco Use: Never    Smokeless Tobacco Use: Never    Passive Exposure: Not on file  Financial Resource Strain: Low Risk  (12/30/2023)   Received from [facility]   Overall Financial Resource Strain (CARDIA)    Difficulty of Paying Living Expenses: Not hard at all  Food Insecurity: No Food Insecurity (12/30/2023)   Received from [facility]   Epic    Within the past 12 months, you worried that your food would run out before you got the money to buy more.: Never true    Within the past 12 months, the food you bought just didn't last and you didn't have money to get more.: Never true  Transportation Needs: No Transportation Needs (12/30/2023)   Received from [facility] - Transportation    In the past 12 months, has lack of transportation kept you from medical appointments or from getting medications?: No    Lack of Transportation (Non-Medical): No  Physical Activity: Not on file  Stress: Not on file  Social Connections: Socially Integrated (09/21/2023)   Social Connection and Isolation Panel    Frequency of Communication with Friends and Family: More than three times a week    Frequency of Social Gatherings with Friends and Family: More than three times a week    Attends  Religious Services: 1 to 4 times per year    Active Member of [employer] or Organizations: Yes    Attends Banker Meetings: 1 to 4 times per year    Marital Status: Married  Catering Manager Violence: Not At Risk (09/21/2023)   Humiliation, Afraid, Rape, and Kick questionnaire    Fear of Current or Ex-Partner: No    Emotionally Abused: No    Physically Abused: No    Sexually Abused: No  Depression (PHQ2-9): Not on file  Alcohol Screen: Not on file  Housing: Unknown (01/02/2024)   Received from [facility]   Epic    In the last 12 months, was there a time when you were not able to pay the mortgage or rent on time?: Patient declined    In the past  12 months, how many times have you moved where you were living?: 0    At any time in the past 12 months, were you homeless or living in a shelter (including now)?: No  Utilities: Not At Risk (12/30/2023)   Received from [facility]   Epic    In the past 12 months has the electric, gas, oil, or water company threatened to shut off services in your home?: No  Health Literacy: Not on file      FAMILY HISTORY:  Family History  Problem Relation Age of Onset   Breast cancer Mother 45   Kidney cancer Neg Hx    Bladder Cancer Neg Hx      REVIEW OF SYSTEMS:  Constitutional: denies weight loss, fever, chills, or sweats  Eyes: denies any other vision changes, history of eye injury  ENT: denies sore throat, hearing problems  Respiratory: denies shortness of breath, wheezing  Cardiovascular: denies chest pain, palpitations  Gastrointestinal: positive abdominal pain  Genitourinary: denies burning with urination or urinary frequency Musculoskeletal: denies any other joint pains or cramps  Skin: denies any other rashes or skin discolorations  Neurological: denies any other headache, dizziness, weakness  Psychiatric: denies any other depression, anxiety   All other review of systems were negative    VITAL SIGNS:  Temp:  [97.7 F (36.5 C)-97.8 F (36.6 C)] 97.7 F (36.5 C) (01/19 2011) Pulse Rate:  [75-95] 75 (01/19 2100) Resp:  [13-25] 13 (01/19 2100) BP: (131-161)/(61-110) 139/79 (01/19 2100) SpO2:  [97 %-100 %] 100 % (01/19 2100)             INTAKE/OUTPUT:  This shift: No intake/output data recorded.  Last 2 shifts: @IOLAST2SHIFTS @   PHYSICAL EXAM:  Constitutional:  -- Normal body habitus  -- Awake, alert, and in cooperative Eyes:  -- Pupils equally round and reactive to light  -- No scleral icterus  Ear, nose, and throat:  -- No jugular venous distension  Pulmonary:  -- No crackles  -- Equal breath sounds bilaterally -- Breathing non-labored at rest Cardiovascular:  -- S1, S2 present  -- No pericardial rubs Gastrointestinal:  -- Abdomen soft, tender to palpation in right lower quadrant, non-distended, with guarding but no rebound tenderness Musculoskeletal and Integumentary:  -- Wounds or skin discoloration: None appreciated -- Extremities: B/L UE and LE FROM, hands and feet warm, no edema  Neurologic:  -- Motor function: intact and symmetric -- Sensation: intact and symmetric   Labs:     Latest Ref Rng & Units 05/27/2024    6:56 PM 09/21/2023    5:00 AM 09/20/2023    7:40 PM  CBC  WBC 4.0 - 10.5 K/uL 5.4  6.1  5.8   Hemoglobin 12.0 - 15.0 g/dL 85.2  87.2  85.5   Hematocrit 36.0 - 46.0 % 43.3  36.7  42.2   Platelets 150 - 400 K/uL 234  182  198       Latest Ref Rng & Units 05/27/2024    6:56 PM 09/21/2023    5:00 AM 09/20/2023    7:40 PM  CMP  Glucose 70 - 99 mg/dL 897  90  863   BUN 8 - 23 mg/dL 19  16  19    Creatinine 0.44 - 1.00 mg/dL 9.08  9.17  9.07   Sodium 135 - 145 mmol/L 144  140  139   Potassium 3.5 - 5.1 mmol/L 3.9  3.6  3.8   Chloride 98 - 111 mmol/L 105  108  106   CO2 22 - 32 mmol/L 27  25  26    Calcium 8.9 - 10.3 mg/dL 89.7  8.8  9.7   Total Protein 6.5 - 8.1 g/dL 7.5   6.9   Total Bilirubin 0.0 - 1.2 mg/dL 0.5   1.0   Alkaline  Phos 38 - 126 U/L 95   55   AST 15 - 41 U/L 47   25   ALT 0 - 44 U/L 16   6     Imaging studies:  EXAM: CT ABDOMEN AND PELVIS WITH CONTRAST 05/27/2024 08:05:47 PM   TECHNIQUE: CT of the abdomen and pelvis was performed with the administration of 100 mL (iohexol  (OMNIPAQUE ) 300 MG/ML solution 100 mL IOHEXOL  300 MG/ML SOLN) intravenous contrast. Multiplanar reformatted images are provided for review. Automated exposure control, iterative reconstruction, and/or weight-based adjustment of the mA/kV was utilized to reduce the radiation dose to as low as reasonably achievable.   COMPARISON: CT abdomen and pelvis 07/18/2013.   CLINICAL HISTORY: diffuse tenderness, altered diffuse tenderness, altered   FINDINGS:   LOWER CHEST: No acute abnormality.   LIVER: The liver is unremarkable.   GALLBLADDER AND BILE DUCTS: Gallbladder is unremarkable. No biliary ductal dilatation.   SPLEEN: No acute abnormality.   PANCREAS: No acute abnormality.   ADRENAL GLANDS: No acute abnormality.   KIDNEYS, URETERS AND BLADDER: No stones in the kidneys or ureters. No hydronephrosis. No perinephric or periureteral stranding. The bladder is distended.   GI AND BOWEL: Stomach demonstrates no acute abnormality. There is a small hiatal hernia. The appendix is dilated and fluid filled measuring up to 9 mm. There is mild surrounding inflammatory stranding. No evidence for perforation or abscess. There is no bowel obstruction.   PERITONEUM AND RETROPERITONEUM: No ascites. No free air.   VASCULATURE: Aorta is normal in caliber. There are atherosclerotic calcifications of the aorta and iliac arteries. 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   LYMPH NODES: No lymphadenopathy.   REPRODUCTIVE ORGANS: Uterus is surgically absent. There is fluid in the vagina.   BONES AND SOFT TISSUES: No acute osseous abnormality. No focal soft tissue abnormality.    IMPRESSION: 1. Acute appendicitis with mild surrounding inflammatory stranding. No perforation or abscess. 2. Small hiatal hernia. 3. 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   Electronically signed by: Greig Pique MD 05/27/2024 08:18 PM EST RP Workstation: [id number]  Assessment/Plan:  84 y.o. female with acute appendicitis, complicated by pertinent comorbidities including Parkinson disease.  Patient with history, physical exam and images consistent with acute appendicitis. Patient oriented about diagnosis and surgical management as treatment. Patient oriented about goals of surgery and its risk including: bowel injury, infection, abscess, bleeding, leak from cecum, intestinal adhesions, bowel obstruction, fistula, injury to the ureter among others.  Patient understood and agreed to proceed with surgery. Will admit patient, already started on antibiotic therapy, will give IV hydration since patient is NPO and schedule to OR.   Appreciate hospitalist evaluation and management of medical comorbidities  Lucas Petrin, MD      [1]  Allergies Allergen Reactions   Tramadol Swelling   Nitrofuran Derivatives Nausea Only   Simvastatin Other (See Comments)    Muscle pain and wasting, liver function abnormalities Other reaction(s): Other (See Comments) Other reaction(s): Other (See Comments) Muscle pain and wasting, liver function abnormalities Unspecified   Ultram [Tramadol Hcl] Swelling   Adhesive [Tape] Rash    Surgical tape   Wound Dressing  Adhesive Rash    Surgical tape   "

## 2024-05-27 NOTE — Op Note (Signed)
 Pre-op Diagnosis: Acute appendicitis   Post op Diagnosis: Acute appenditicis  Procedure: Robotic assisted laparoscopic appendectomy.  Anesthesia: GETA  Surgeon: Lucas Sjogren, MD, FACS  Wound Classification: clean contaminated  Specimen: Appendix  Complications: None  Estimated Blood Loss: 3 mL   Indications: Patient is a 84 y.o. female  presented with above right lower quadrant pain. CT scan shows acute appendicitis.     FIndings: 1.  Suppurative appendix  2. No peri-appendiceal abscess or phlegmon 3. Normal anatomy 4. Adequate hemostasis.   Description of procedure: The patient was placed on the operating table in the supine position. General anesthesia was induced. A time-out was completed verifying correct patient, procedure, site, positioning, and implant(s) and/or special equipment prior to beginning this procedure. The abdomen was prepped and draped in the usual sterile fashion.   Palmer's point located and Veress needle was inserted.  After confirming 2 clicks and a positive saline drop test, gas insufflation was initiated until the abdominal pressure was measured at 15 mmHg.  Afterwards, the Veress needle was removed and a 8 mm port was placed in left upper quadrant area using Optiview technique.  After local was infused, 3 additional incision on the left abdominal wall were made 5 cm apart.  An 12 mm port and two other 8 mm ports were placed under direct visualization.  No injuries from trocar placements were noted.  The table was placed in the Trendelenburg position with the right side elevated.  With the use of Tip up grasper, fenestrated bipolar and monopolar scissors, an inflamed appendix was identified and elevated.  Window created at base of appendix in the mesentery.    The mesoappendix was divided with combination of bipolar energy and monopolar scissors.  The base of the appendix was ligated with 3-0 Stratafix.  The appendix was divided with monopolar  scissors.  A second layer of the 3-0 Stratafix was done over the appendiceal stump.  The appendiceal stump was examined and hemostasis noted. No other pathology was identified within pelvis. The 12 mm trocar removed and port site closed with PMI using 0 vicryl under direct vision. Remaining trocars were removed under direct vision. No bleeding was noted.The abdomen was allowed to collapse.  All skin incisions then closed with subcuticular sutures Monocryl 4-0.  Wounds then dressed with dermabond.  The patient tolerated the procedure well, awakened from anesthesia and was taken to the postanesthesia care unit in satisfactory condition.  Sponge count and instrument count correct at the end of the procedure.

## 2024-05-27 NOTE — Assessment & Plan Note (Deleted)
 Delirium secondary to acute medical condition-appendicitis Continue Continue Seroquel, mirtazapine  Patient follows at the movement disorders clinic at [facility], last seen 04/02/2024

## 2024-05-27 NOTE — Assessment & Plan Note (Addendum)
 Dementia due to Parkinson's disease with behavioral disturbance Delirium secondary to acute medical condition-appendicitis Continue Continue Seroquel, mirtazapine  Patient follows at the movement disorders clinic at [facility], last seen 04/02/2024 Delirium precautions Neurologic checks, fall precautions

## 2024-05-27 NOTE — Consult Note (Signed)
 Initial Consultation Note   Patient: Julie Lynn FMW:[id number] DOB: Jul 18, 1940 PCP: Suzzane Charleston, PA-C DOA: 05/27/2024 DOS: the patient was seen and examined on 05/27/2024 Primary service: Rodolph Romano, MD  Referring physician: Dr Cesar Reason for consult: Medical management  Assessment/Plan: Assessment and Plan: * Acute appendicitis with localized peritonitis Management per surgery Currently on Zosyn  Pain control per surgery  Acute metabolic encephalopathy Dementia due to Parkinson's disease with behavioral disturbance Delirium secondary to acute medical condition-appendicitis Continue Continue Seroquel, mirtazapine  Patient follows at the movement disorders clinic at [facility], last seen 04/02/2024 Delirium precautions Neurologic checks, fall precautions  History of Dysautonomia orthostatic hypotension syndrome with syncope Frequent falls At baseline uses walker and wheelchair Avoid aggressive treatment of elevated blood pressure-can consider treating SBP if above 200 Maintain adequate hydration Fall precautions Assistance with getting out of bed  Parkinson's disease (HCC) Continue Sinemet  Intolerant of ropinirole       [facility] will continue to follow the patient.  HPI: Julie Lynn is a 84 y.o. female with past medical history of Parkinson's disease complicated by dysautonomia/orthostatic hypotension/ syncope/frequent falls, as well as dementia with sundowning (agitation, aggression, hallucinations), followed at the [facility] movement disorders clinic, last seen 04/02/2024, and who at baseline occasionally uses a walker and wheelchair being seen in medical consultation for management of medical problems after being admitted to the surgical service for acute appendicitis  She presented by EMS with altered mental status after her home health nurse found her to be not acting herself.   History taken from patient's daughter and her nighttime caregiver reveals that during  the day she became very sluggish whereas at baseline she tends to be vibrant.  The caregiver noted her slumping and sliding down in the chair and then she stretched out and her whole body appeared to tremble/tremor.  She said she has done this before but this time it appeared worse.  There was no flexion extension of the arms, incontinence or tongue biting.  Patient was awake and was able to say  I cannot stop shaking while it was occurring.  She then appeared confused and started saying things that did not make sense and then they decided to call EMS to bring her in. During workup in the ED, she was found to have abdominal tenderness on physical exam.  CT abdomen and pelvis revealing acute appendicitis.  There was also concern for dark cloudy urine and possible UTI but urinalysis showed just small leuks and rare bacteria. Her vitals were normal on arrival, with BP 161/90, and labs were within normal limits including WBC and lactic acid. Patient evaluated postoperatively and was resting comfortably     Past Medical History:  Diagnosis Date   Anxiety    Cancer (HCC) 05/2017   skin   Depression    GERD (gastroesophageal reflux disease)    Heartburn    Hepatitis A 1962   HLD (hyperlipidemia)    HTN (hypertension)    Hypertension    Parkinson disease (HCC)    Scoliosis    Vertigo    rare   Past Surgical History:  Procedure Laterality Date   ABDOMINAL HYSTERECTOMY  1977   ABDOMINAL HYSTERECTOMY     CATARACT EXTRACTION W/PHACO Left 11/01/2017   Procedure: CATARACT EXTRACTION PHACO AND INTRAOCULAR LENS PLACEMENT (IOC) LEFT;  Surgeon: Mittie Gaskin, MD;  Location: [facility];  Service: Ophthalmology;  Laterality: Left;   CATARACT EXTRACTION W/PHACO Right 12/12/2018   Procedure: CATARACT EXTRACTION PHACO AND INTRAOCULAR LENS PLACEMENT (IOC)  RIGHT;  Surgeon: Mittie Gaskin, MD;  Location: [facility];  Service: Ophthalmology;  Laterality: Right;   COLONOSCOPY  WITH PROPOFOL  N/A 06/15/2018   Procedure: COLONOSCOPY WITH PROPOFOL ;  Surgeon: Gaylyn Gladis PENNER, MD;  Location: [facility] END[facility]COPY;  Service: Endoscopy;  Laterality: N/A;   ESOPHAGOGASTRODUODENOSCOPY (EGD) WITH PROPOFOL  N/A 06/15/2018   Procedure: ESOPHAGOGASTRODUODENOSCOPY (EGD) WITH PROPOFOL ;  Surgeon: Gaylyn Gladis PENNER, MD;  Location: [facility] ENDOSCOPY;  Service: Endoscopy;  Laterality: N/A;   Social History:  reports that she has never smoked. She has never used smokeless tobacco. She reports that she does not currently use alcohol. She reports that she does not use drugs.  Allergies[1]  Family History  Problem Relation Age of Onset   Breast cancer Mother 76   Kidney cancer Neg Hx    Bladder Cancer Neg Hx     Prior to Admission medications  Medication Sig Start Date End Date Taking? Authorizing Provider  amLODipine  (NORVASC ) 2.5 MG tablet Take 2.5 mg by mouth daily.    [provider]  carbidopa -levodopa  (SINEMET  CR) 50-200 MG tablet Take 1 tablet by mouth at bedtime.    [provider]  carbidopa -levodopa  (SINEMET  IR) 25-100 MG tablet Take 2 tablets by mouth 4 (four) times daily. Patient taking differently: Take 2 tablets by mouth 4 (four) times daily. TAKING 2 TABLETS BY MOUTH 3 TIMES DAILY at 10am, 1-2pm, 5-6pm 09/11/23   [provider]  escitalopram  (LEXAPRO ) 5 MG tablet Take 5 mg by mouth daily.    [provider]  ezetimibe  (ZETIA ) 10 MG tablet Take 10 mg by mouth daily. 07/19/16   [provider]  mirtazapine  (REMERON ) 15 MG tablet Take 15 mg by mouth at bedtime. 05/17/21   [provider]  omeprazole (PRILOSEC) 20 MG capsule Take 20 mg by mouth daily. 05/24/16   [provider]  risperiDONE  (RISPERDAL ) 0.25 MG tablet Take 0.25 mg by mouth. 09/22/23   [provider]  Vitamin D , Ergocalciferol , (DRISDOL ) 50000 units CAPS capsule Take 50,000 Units by mouth every 7 (seven) days. Every Wed 03/08/16   [provider]    Physical Exam: Vitals:   05/27/24 1844 05/27/24 2011  BP: (!) 161/90 (!) 161/61  Pulse: 92 95  Resp: 14 (!) 25  Temp: 97.8 F (36.6 C) 97.7 F (36.5 C)  TempSrc: Oral Oral  SpO2: 97% 100%   Physical Exam Vitals and nursing note reviewed.  Constitutional:      General: She is sleeping. She is not in acute distress. HENT:     Head: Normocephalic and atraumatic.  Cardiovascular:     Rate and Rhythm: Normal rate and regular rhythm.     Heart sounds: Normal heart sounds.  Pulmonary:     Effort: Pulmonary effort is normal.     Breath sounds: Normal breath sounds.  Abdominal:     Palpations: Abdomen is soft.     Tenderness: There is no abdominal tenderness.  Neurological:     Mental Status: She is easily aroused.     Data Reviewed: Relevant notes from primary care and specialist visits, past discharge summaries as available in EHR, including Care Everywhere. Prior diagnostic testing as pertinent to current admission diagnoses Updated medications and problem lists for reconciliation ED course, including vitals, labs, imaging, treatment and response to treatment Triage notes, nursing and pharmacy notes and ED provider's notes Notable results as noted in HPI   Family Communication: none Primary team communication: Dr Cesar Thank you very much for involving us   in the care of your patient.  Author: Delayne LULLA Solian, MD 05/27/2024 9:03 PM  For on call review [email or web address].      [1]  Allergies Allergen Reactions   Tramadol Swelling   Nitrofuran Derivatives Nausea Only   Simvastatin Other (See Comments)    Muscle pain and wasting, liver function abnormalities Other reaction(s): Other (See Comments) Other reaction(s): Other (See Comments) Muscle pain and wasting, liver function abnormalities Unspecified   Ultram [Tramadol Hcl] Swelling   Adhesive [Tape] Rash    Surgical tape   Wound Dressing Adhesive Rash    Surgical tape

## 2024-05-27 NOTE — Assessment & Plan Note (Addendum)
 Management per surgery Currently on Zosyn  Pain control per surgery

## 2024-05-27 NOTE — Assessment & Plan Note (Addendum)
 Frequent falls At baseline uses walker and wheelchair Avoid aggressive treatment of elevated blood pressure-can consider treating SBP if above 200 Maintain adequate hydration Fall precautions Assistance with getting out of bed

## 2024-05-27 NOTE — ED Triage Notes (Signed)
 Pt coming in via [facility] due to home health nurse calling out because pt was not acting herself. Pt has been having dark, cloudy, painful urination for the past 3 days. Pt has history of UTIs, hypertension and parkinson disease. No acute distress at this time. MD Claudene at bedside

## 2024-05-27 NOTE — Anesthesia Procedure Notes (Signed)
 Procedure Name: Intubation Date/Time: 05/27/2024 10:30 PM  Performed by: Jaylene Nest, CRNAPre-anesthesia Checklist: Patient identified, Patient being monitored, Timeout performed, Emergency Drugs available and Suction available Patient Re-evaluated:Patient Re-evaluated prior to induction Oxygen Delivery Method: Circle system utilized Preoxygenation: Pre-oxygenation with 100% oxygen Induction Type: IV induction Ventilation: Mask ventilation without difficulty Laryngoscope Size: 3 and McGrath Grade View: Grade I Tube type: Oral Tube size: 7.0 mm Number of attempts: 1 Airway Equipment and Method: Stylet and Video-laryngoscopy Placement Confirmation: ETT inserted through vocal cords under direct vision, positive ETCO2 and breath sounds checked- equal and bilateral Secured at: 21 cm Tube secured with: Tape Dental Injury: Teeth and Oropharynx as per pre-operative assessment  Difficulty Due To: Difficult Airway- due to anterior larynx

## 2024-05-27 NOTE — ED Provider Notes (Signed)
 "  [facility] Provider Note    Event Date/Time   First MD Initiated Contact with Patient 05/27/24 1839     (approximate)   History   Altered Mental Status   HPI  Julie Lynn is a 84 y.o. female who presents to the ED for evaluation of Altered Mental Status   Review a neurology clinic visit from November.  History of Parkinson's disease.   Patient presents to the ED from home via EMS for evaluation of altered mentation and abdominal bloating sensation.  Initial history is somewhat limited, EMS reports she has had a few days of dysuria and progressive encephalopathy.  Patient is disoriented on arrival and unable provide much relevant history initially.  Much of the history is provided by patient's daughter when she arrived to the bedside later.  Reports seeing the patient at 1 PM and she was normal and fine, complaining of some mild abdominal bloating sensation at that point.  This evening she got a call from home caregiver indicating that patient was not acting normally.   Physical Exam   Triage Vital Signs: ED Triage Vitals  Encounter Vitals Group     BP      Girls Systolic BP Percentile      Girls Diastolic BP Percentile      Boys Systolic BP Percentile      Boys Diastolic BP Percentile      Pulse      Resp      Temp      Temp src      SpO2      Weight      Height      Head Circumference      Peak Flow      Pain Score      Pain Loc      Pain Education      Exclude from Growth Chart     Most recent vital signs: Vitals:   05/27/24 1844 05/27/24 2011  BP: (!) 161/90 (!) 161/61  Pulse: 92 95  Resp: 14 (!) 25  Temp: 97.8 F (36.6 C) 97.7 F (36.5 C)  SpO2: 97% 100%    General: Awake, no distress.  Initially encephalopathic, oriented only to herself, disoriented to situation, time, location CV:  Good peripheral perfusion.  Resp:  Normal effort.  Abd:  No distention.  Diffuse lower abdominal tenderness MSK:  No deformity noted.   Neuro:  No focal deficits appreciated.   Other:     ED Results / Procedures / Treatments   Labs (all labs ordered are listed, but only abnormal results are displayed) Labs Reviewed  URINALYSIS, ROUTINE W REFLEX MICROSCOPIC - Abnormal; Notable for the following components:      Result Value   Color, Urine STRAW (*)    APPearance CLEAR (*)    Leukocytes,Ua SMALL (*)    Bacteria, UA RARE (*)    All other components within normal limits  COMPREHENSIVE METABOLIC PANEL WITH GFR - Abnormal; Notable for the following components:   Glucose, Bld 102 (*)    AST 47 (*)    All other components within normal limits  URINE CULTURE  CULTURE, BLOOD (SINGLE)  CBC WITH DIFFERENTIAL/PLATELET  MAGNESIUM   LACTIC ACID, PLASMA    EKG Sinus rhythm at rate 94 bpm, right bundle, no STEMI  RADIOLOGY CT head interpreted by me without evidence of acute intracranial pathology CXR interpreted by me without evidence of acute cardiopulmonary pathology. CT abdomen/pelvis interpreted by me with  acute uncomplicated appendicitis  Official radiology report(s): CT HEAD WO CONTRAST ( ) Result Date: 05/27/2024 EXAM: CT HEAD WITHOUT CONTRAST 05/27/2024 08:05:47 PM TECHNIQUE: CT of the head was performed without the administration of intravenous contrast. Automated exposure control, iterative reconstruction, and/or weight based adjustment of the mA/kV was utilized to reduce the radiation dose to as low as reasonably achievable. COMPARISON: 10/21/2023 CLINICAL HISTORY: sudden mental status change, hx parkinson's FINDINGS: BRAIN AND VENTRICLES: No acute hemorrhage. No evidence of acute infarct. Age-related atrophy. Mild chronic microvascular ischemic changes. 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 Additional small remote infarct in the superior left cerebellum. No hydrocephalus. No extra-axial collection. No mass effect or midline shift. ORBITS: Bilateral lens replacement. SINUSES: No acute abnormality. SOFT  TISSUES AND SKULL: No acute soft tissue abnormality. No skull fracture. IMPRESSION: 1. No acute intracranial abnormality. Electronically signed by: Donnice Mania MD 05/27/2024 08:19 PM EST RP Workstation: [id number]   CT ABDOMEN PELVIS W CONTRAST Result Date: 05/27/2024 EXAM: CT ABDOMEN AND PELVIS WITH CONTRAST 05/27/2024 08:05:47 PM TECHNIQUE: CT of the abdomen and pelvis was performed with the administration of 100 mL (iohexol  (OMNIPAQUE ) 300 MG/ML solution 100 mL IOHEXOL  300 MG/ML SOLN) intravenous contrast. Multiplanar reformatted images are provided for review. Automated exposure control, iterative reconstruction, and/or weight-based adjustment of the mA/kV was utilized to reduce the radiation dose to as low as reasonably achievable. COMPARISON: CT abdomen and pelvis 07/18/2013. CLINICAL HISTORY: diffuse tenderness, altered diffuse tenderness, altered FINDINGS: LOWER CHEST: No acute abnormality. LIVER: The liver is unremarkable. GALLBLADDER AND BILE DUCTS: Gallbladder is unremarkable. No biliary ductal dilatation. SPLEEN: No acute abnormality. PANCREAS: No acute abnormality. ADRENAL GLANDS: No acute abnormality. KIDNEYS, URETERS AND BLADDER: No stones in the kidneys or ureters. No hydronephrosis. No perinephric or periureteral stranding. The bladder is distended. GI AND BOWEL: Stomach demonstrates no acute abnormality. There is a small hiatal hernia. The appendix is dilated and fluid filled measuring up to 9 mm. There is mild surrounding inflammatory stranding. No evidence for perforation or abscess. There is no bowel obstruction. PERITONEUM AND RETROPERITONEUM: No ascites. No free air. VASCULATURE: Aorta is normal in caliber. There are atherosclerotic calcifications of the aorta and iliac arteries. 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 LYMPH NODES: No lymphadenopathy. REPRODUCTIVE ORGANS: Uterus is surgically absent. There is fluid in the vagina. BONES AND SOFT TISSUES: No acute  osseous abnormality. No focal soft tissue abnormality. IMPRESSION: 1. Acute appendicitis with mild surrounding inflammatory stranding. No perforation or abscess. 2. Small hiatal hernia. 3. 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 Electronically signed by: Greig Pique MD 05/27/2024 08:18 PM EST RP Workstation: [id number]   DG Chest Portable 1 View Result Date: 05/27/2024 CLINICAL DATA:  Altered mental status. EXAM: PORTABLE CHEST 1 VIEW COMPARISON:  Chest abdomen 07/22/2023. FINDINGS: Faint bilateral subpleural and bibasilar densities may represent atelectasis or sequela of atypical infection. Clinical correlation recommended. No pleural effusion or pneumothorax. Stable cardiac silhouette. No acute osseous pathology. IMPRESSION: Faint bilateral subpleural and bibasilar densities may represent atelectasis or sequela of atypical infection. Electronically Signed   By: Vanetta Chou M.D.   On: 05/27/2024 20:13    PROCEDURES and INTERVENTIONS:  .1-3 Lead EKG Interpretation  Performed by: Claudene Rover, MD Authorized by: Claudene Rover, MD     Interpretation: normal     ECG rate:  90   ECG rate assessment: normal     Rhythm: sinus rhythm     Ectopy: none     Conduction: normal  Medications  piperacillin -tazobactam (ZOSYN ) IVPB 3.375 g (has no administration in time range)  mirtazapine  (REMERON ) tablet 15 mg (has no administration in time range)  carbidopa -levodopa  (SINEMET  CR) 50-200 MG per tablet controlled release 1 tablet (has no administration in time range)  escitalopram  (LEXAPRO ) tablet 5 mg (has no administration in time range)  carbidopa -levodopa  (SINEMET  IR) 25-100 MG per tablet immediate release 2 tablet (has no administration in time range)  amLODipine  (NORVASC ) tablet 2.5 mg (has no administration in time range)  risperiDONE  (RISPERDAL ) tablet 0.25 mg (has no administration in time range)  pantoprazole  (PROTONIX ) EC tablet 40 mg (has no administration in time  range)  piperacillin -tazobactam (ZOSYN ) IVPB 3.375 g (has no administration in time range)  enoxaparin  (LOVENOX ) injection 40 mg (has no administration in time range)  acetaminophen  (TYLENOL ) tablet 650 mg (has no administration in time range)    Or  acetaminophen  (TYLENOL ) suppository 650 mg (has no administration in time range)  HYDROcodone -acetaminophen  (NORCO/VICODIN) 5-325 MG per tablet 1-2 tablet (has no administration in time range)  morphine  (PF) 4 MG/ML injection 4 mg (has no administration in time range)  ondansetron  (ZOFRAN -ODT) disintegrating tablet 4 mg (has no administration in time range)    Or  ondansetron  (ZOFRAN ) injection 4 mg (has no administration in time range)  lactated ringers  bolus 1,000 mL (1,000 mLs Intravenous New Bag/Given 05/27/24 1851)  iohexol  (OMNIPAQUE ) 300 MG/ML solution 100 mL (100 mLs Intravenous Contrast Given 05/27/24 1955)     IMPRESSION / MDM / ASSESSMENT AND PLAN / ED COURSE  I reviewed the triage vital signs and the nursing notes.  Differential diagnosis includes, but is not limited to, stroke or ICH, metabolic cephalopathy, UTI, diverticulitis, acute appendicitis  {Patient presents with symptoms of an acute illness or injury that is potentially life-threatening.  Patient presents to the ED with evidence of a metabolic cephalopathy in the setting of acute uncomplicated appendicitis.  Nonfocal exam, initially disoriented but she actually clears from a mental status perspective on my reevaluations and seems to be back to baseline.  Lower abdominal tenderness is noted.  Blood work is normal without leukocytosis, renal dysfunction.  Normal lactic acid, UA without clear infectious features, sent for culture.  CT head without acute features, CT abdomen with signs of appendicitis.  I consulted surgery who agrees to admit, consult medicine to follow along at surgery's request  Clinical Course as of 05/27/24 2046  Mon May 27, 2024  1924 Reassessed, daughter  at the bedside [DS]  2030 Update patient and family of CT results. Page surgery.  Patient is much more awake, alert and seems at her baseline than my initial evaluation. [DS]  2034 I consult with Dr. Cesar.  He will review CT scan and call me back [DS]  2041 I discussed with surgery again.  He will admit.  Requests medicine consult to be on board but he will admit primarily [DS]    Clinical Course User Index [DS] Claudene Rover, MD     FINAL CLINICAL IMPRESSION(S) / ED DIAGNOSES   Final diagnoses:  Acute appendicitis with localized peritonitis, without perforation, abscess, or gangrene  Acute metabolic encephalopathy     Rx / DC Orders   ED Discharge Orders     None        Note:  This document was prepared using Dragon voice recognition software and may include unintentional dictation errors.   Claudene Rover, MD 05/27/24 2047  "

## 2024-05-27 NOTE — Assessment & Plan Note (Signed)
 Continue Sinemet  Intolerant of ropinirole

## 2024-05-27 NOTE — Assessment & Plan Note (Signed)
-  Continue escitalopram  and mirtazapine

## 2024-05-28 ENCOUNTER — Other Ambulatory Visit: Payer: Self-pay

## 2024-05-28 ENCOUNTER — Encounter: Payer: Self-pay | Admitting: General Surgery

## 2024-05-28 LAB — URINE CULTURE: Culture: >=100000 — AB

## 2024-05-28 MED ORDER — HYDROCODONE-ACETAMINOPHEN 5-325 MG PO TABS
1.0000 | ORAL_TABLET | Freq: Three times a day (TID) | ORAL | 0 refills | Status: AC | PRN
  Filled 2024-05-28: qty 10, 4d supply, fill #0

## 2024-05-28 MED ORDER — HYDROMORPHONE HCL 1 MG/ML IJ SOLN
INTRAMUSCULAR | Status: AC
  Filled 2024-05-28: qty 1

## 2024-05-28 NOTE — Discharge Instructions (Signed)

## 2024-05-28 NOTE — Discharge Summary (Signed)
 [facility]-General Surgery  SURGICAL DISCHARGE SUMMARY  Patient ID: Julie Lynn MRN: [id number] DOB/AGE: 85-May-1942 85 y.o.  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  In the ED, patient had a BP of 161/61, HR 92 bpm, RR 14 and temp 97.8 F.  No leukocytosis indicated on CBC.  AST 47, ALT 16, alkaline phos 95, and total bili 0.5.  Lactic acid was normal 1.6.  Per chart review, on exam patient presented with diffuse lower abdominal tenderness.  CT of abdomen and pelvis was ordered which showed dilated appendix with fluid-filled measuring up to 9 millimeters and mild surrounding inflammatory stranding.  No evidence of perforation or abscess.  Hospitalist was consulted due to patient's altered mental status and Parkinson's history. Patient was taken to the operating room later that night on 05/27/2024 for robotic assisted laparoscopic appendectomy. Surgery went well. Patient tolerated procedure.  Patient is now tolerating regular diet post-op. Surgical incisions show no signs of infection. Pain is overall well-controlled with pain medication. Patient is clear from surgical standpoint.  Patient will  follow-up outpatient with Dr. Cesar in 2 weeks.     Physical Examination:  Constitutional: alert, in no acute distress Pulmonary: CTA bilaterally, normal breath sounds Cardiac: regular rate and rhythm Gastrointestinal: soft, mild generalized tenderness, and non-distended Skin: abdominal incisions look clean, dry, and intact with surgical glue in place    Allergies as of 05/28/2024       Reactions   Tramadol Swelling   Nitrofuran Derivatives Nausea Only   Simvastatin Other (See Comments)   Muscle pain and wasting, liver function abnormalities Other reaction(s): Other (See Comments) Other reaction(s): Other (See Comments) Muscle pain and wasting, liver function abnormalities Unspecified   Ultram [tramadol Hcl] Swelling   Adhesive [tape] Rash   Surgical tape   Wound Dressing Adhesive Rash   Surgical tape        Medication List     TAKE these medications    carbidopa -levodopa  50-200 MG tablet Commonly known as: SINEMET  CR Take 1 tablet by mouth at bedtime. What changed: Another medication with the same name was changed. Make sure you understand how and when to take each. The timing of this medication is very important.   carbidopa -levodopa  25-100 MG tablet Commonly known as: SINEMET  IR Take 2 tablets by mouth 4 (four) times daily. What changed: additional instructions The timing of this medication is very important.   amLODipine  2.5 MG tablet Commonly known as: NORVASC  Take 2.5 mg by mouth daily.   escitalopram  5 MG tablet Commonly known as: LEXAPRO  Take 5 mg by mouth daily.   ezetimibe  10 MG tablet Commonly known as: ZETIA  Take 10 mg by mouth daily.   HYDROcodone -acetaminophen  5-325 MG tablet Commonly known as: NORCO/VICODIN Take 1 tablet by mouth every 8 (eight)  hours as needed for up to 3 days.   mirtazapine  15 MG tablet Commonly known as: REMERON  Take 15 mg by mouth at bedtime.   nitrofurantoin  50 MG capsule Commonly known as: MACRODANTIN  Take  50 mg by mouth at bedtime.   omeprazole 20 MG capsule Commonly known as: PRILOSEC Take 20 mg by mouth daily.   QUEtiapine 25 MG tablet Commonly known as: SEROQUEL Take 75 mg by mouth at bedtime.   risperiDONE  0.25 MG tablet Commonly known as: RISPERDAL  Take 0.25 mg by mouth.   Vitamin D  (Ergocalciferol ) 1.25 MG (50000 UNIT) Caps capsule Commonly known as: DRISDOL  Take 50,000 Units by mouth every 7 (seven) days. Every Wed          Follow-up Information     Rodolph Romano, MD Follow up in 2 week(s).   Specialty: General Surgery Why: 2 weeks s/p robotic assisted lap appendectomy Contact information: [address] [place] [address] [phone]                  Time spent on discharge management including discussion of hospital course, clinical condition, outpatient instructions, prescriptions, and follow up with the patient and members of the medical team: >30 minutes  Bracen Schum Barrientos PA-C

## 2024-05-28 NOTE — TOC Initial Note (Signed)
 Transition of Care [facility]) - Initial/Assessment Note    Patient Details  Name: Julie Lynn MRN: [id number] Date of Birth: January 16, 1941  [facility] CM/SW Contact:    Julie ONEIDA Haddock, RN Phone Number: 05/28/2024, 2:25 PM  Clinical Narrative:                    Admitted for: S/P lap appy Admitted from: home  PCP: Julie Lynn Current home health/prior home health/DME: Transport chair, RW, BSC, bed alarm   Therapy recommending follow MD recs.   MD has ordered [facility] Discussed with daughter Julie Lynn.  She declines to purse SNF and in agreement  for home health.  She request [facility], and request therapist Julie Lynn.  Accepted [facility] in the HUB. She states that patient has 24/7 care in the home.  She declines EMS transport at discharge, and states she and her brother will be transporting at dc     Patient Goals and CMS Choice            Expected Discharge Plan and Services         Expected Discharge Date: 05/28/24                                    Prior Living Arrangements/Services                       Activities of Daily Living   ADL Screening (condition at time of admission) Independently performs ADLs?: No Does the patient have a NEW difficulty with bathing/dressing/toileting/self-feeding that is expected to last >3 days?: No Does the patient have a NEW difficulty with getting in/out of bed, walking, or climbing stairs that is expected to last >3 days?: No Does the patient have a NEW difficulty with communication that is expected to last >3 days?: No  Permission Sought/Granted                  Emotional Assessment              Admission diagnosis:  Acute appendicitis with localized peritonitis [K35.30] Acute metabolic encephalopathy [G93.41] Acute appendicitis with localized peritonitis, without perforation, abscess, or gangrene [K35.30] Patient Active Problem List   Diagnosis Date Noted   Acute appendicitis with localized peritonitis  05/27/2024   History of Dysautonomia orthostatic hypotension syndrome with syncope 05/27/2024   Acute metabolic encephalopathy 05/27/2024   Parkinson's disease (HCC) 05/27/2024   Essential hypertension 09/21/2023   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, [place] - [address] [place] [address] Phone: [phone] Fax: [phone]     Social Drivers of Health (SDOH) Social History: SDOH Screenings   Food Insecurity: Patient Unable To Answer (05/28/2024)  Housing: Unknown (05/28/2024)  Transportation Needs: No Transportation Needs (05/28/2024)  Utilities: Not At Risk (05/28/2024)  Financial Resource Strain: Low Risk  (12/30/2023)   Received from [facility]  Social Connections: Socially Integrated (05/28/2024)  Tobacco Use: Low Risk (05/27/2024)   SDOH Interventions:     Readmission Risk Interventions     No data to display

## 2024-05-28 NOTE — Anesthesia Postprocedure Evaluation (Signed)
"   Anesthesia Post Note  Patient: Julie Lynn  Procedure(s) Performed: APPENDECTOMY, ROBOT-ASSISTED, LAPAROSCOPIC  Patient location during evaluation: PACU Anesthesia Type: General Level of consciousness: awake Pain management: pain level controlled Vital Signs Assessment: post-procedure vital signs reviewed and stable Respiratory status: spontaneous breathing, nonlabored ventilation and respiratory function stable Cardiovascular status: blood pressure returned to baseline and stable Postop Assessment: no apparent nausea or vomiting Anesthetic complications: no   No notable events documented.   Last Vitals:  Vitals:   05/28/24 0429 05/28/24 0550  BP: (!) 106/48 (!) 145/70  Pulse: 67 90  Resp: 18 12  Temp: 36.8 C   SpO2: 95% 95%    Last Pain:  Vitals:   05/28/24 0045  TempSrc:   PainSc: Asleep                 Camellia Merilee Louder      "

## 2024-05-28 NOTE — Evaluation (Signed)
 Physical Therapy Evaluation Patient Details Name: Julie Lynn MRN: [id number] DOB: February 19, 1941 Today's Date: 05/28/2024  History of Present Illness  84 y.o. female presented to [facility] ED for evaluation of abdominal bloating.  Patient history taking is limited due to her Parkinson disease at baseline.  Daughter is at bedside.  As per daughter patient has been bloated for the last 2 to 3 days and today she was complaining of lower abdominal pain.  It was reported that she presented with altered mental status but as per patient these altered mental status change has been chronic due to her Parkinson.  This is not new for them.  They denies any fever.  He denies any nausea or vomiting.  Pain localized to the lower abdomen.  No pain radiation.  There has been no alleviating or aggravating factors.  Clinical Impression  Patient noted to be in supine position at PT arrival in room, for an initial PT evaluation due to a decline in functional status, with baseline mobility reported as needs assistance for all mobility and ADLs from full-time caregivers, and currently requiring varying levels of max/totalA for bed mobility to standing at bedside. The patient has cognitive deficits at baseline, presenting with fair willingness to work with PT. The patient resides in a house and lives with caregiver with family/friend support. There are no STE inside the residence.  The overall clinical impression is that the patient presents with severe mobility limitations. Recommended skilled PT will address safety, mobility, and discharge planning.        If plan is discharge home, recommend the following: Two people to help with bathing/dressing/bathroom;Two people to help with walking and/or transfers;Assistance with feeding;Assistance with cooking/housework;Help with stairs or ramp for entrance;Assist for transportation;Supervision due to cognitive status;Direct supervision/assist for medications management;Direct  supervision/assist for financial management   Can travel by private vehicle        Equipment Recommendations None recommended by PT  Recommendations for Other Services       Functional Status Assessment Patient has had a recent decline in their functional status and/or demonstrates limited ability to make significant improvements in function in a reasonable and predictable amount of time     Precautions / Restrictions Restrictions Weight Bearing Restrictions Per Provider Order: No      Mobility  Bed Mobility Overal bed mobility: Needs Assistance Bed Mobility: Supine to Sit, Sit to Supine     Supine to sit: Max assist, +2 for physical assistance, Used rails, HOB elevated Sit to supine: Max assist, +2 for physical assistance   General bed mobility comments: + 3 assist to don under wear while standing at bedside; required physical assistance of LE to take one step backwards in bed with significant increased time and multiple modal cueing    Transfers Overall transfer level: Needs assistance Equipment used: Rolling walker (2 wheels), 2 person hand held assist Transfers: Sit to/from Stand Sit to Stand: Max assist, +2 physical assistance           General transfer comment: +2 to stand able to static stand with minA although standing non functional due to fear of falling    Ambulation/Gait                  Stairs            Wheelchair Mobility     Tilt Bed    Modified Rankin (Stroke Patients Only)       Balance Overall balance assessment: Needs assistance Sitting-balance support: Feet  supported Sitting balance-Leahy Scale: Fair     Standing balance support: During functional activity, Reliant on assistive device for balance Standing balance-Leahy Scale: Poor                               Pertinent Vitals/Pain Pain Assessment Pain Assessment: PAINAD Breathing: normal Negative Vocalization: none Facial Expression: sad,  frightened, frown Body Language: tense, distressed pacing, fidgeting Consolability: no need to console PAINAD Score: 2 Pain Intervention(s): Monitored during session, Limited activity within patient's tolerance, Premedicated before session    Home Living Family/patient expects to be discharged to:: Private residence Living Arrangements: Other (Comment) Available Help at Discharge: Personal care attendant;Available 24 hours/day Type of Home: House Home Access: Stairs to enter   [employer] of Steps: has chair lift   Home Layout: One level Home Equipment: [employer] (2 wheels);BSC/3in1;Shower seat;Grab bars - toilet;Grab bars - tub/shower      Prior Function Prior Level of Function : History of Falls (last six months);Needs assist  Cognitive Assist : ADLs (cognitive);Mobility (cognitive)     Physical Assist : Mobility (physical);ADLs (physical)     Mobility Comments: patient has caregivers 24/7 ADLs Comments: caregivers assist as needed     Extremity/Trunk Assessment   Upper Extremity Assessment Upper Extremity Assessment: Generalized weakness    Lower Extremity Assessment Lower Extremity Assessment: Generalized weakness (UE and LE tremors)    Cervical / Trunk Assessment Cervical / Trunk Assessment: Normal  Communication   Communication Communication: Impaired Factors Affecting Communication: Difficulty expressing self    Cognition Arousal: Alert Behavior During Therapy: Flat affect, Anxious   PT - Cognitive impairments: History of cognitive impairments                         Following commands: Impaired Following commands impaired: Only follows one step commands consistently     Cueing Cueing Techniques: Verbal cues     General Comments      Exercises     Assessment/Plan    PT Assessment Patient needs continued PT services  PT Problem List Decreased strength;Decreased activity tolerance;Decreased mobility;Decreased  cognition;Decreased safety awareness;Decreased balance       PT Treatment Interventions Gait training;Stair training;Functional mobility training;Therapeutic activities;Therapeutic exercise;Balance training;Patient/family education;Neuromuscular re-education    PT Goals (Current goals can be found in the Care Plan section)  Acute Rehab PT Goals PT Goal Formulation: Patient unable to participate in goal setting    Frequency Min 2X/week     Co-evaluation               AM-PAC PT 6 Clicks Mobility  Outcome Measure Help needed turning from your back to your side while in a flat bed without using bedrails?: A Little Help needed moving from lying on your back to sitting on the side of a flat bed without using bedrails?: A Little Help needed moving to and from a bed to a chair (including a wheelchair)?: A Lot Help needed standing up from a chair using your arms (e.g., wheelchair or bedside chair)?: A Lot Help needed to walk in hospital room?: Total Help needed climbing 3-5 steps with a railing? : Total 6 Click Score: 12    End of Session   Activity Tolerance: Patient tolerated treatment well;Patient limited by fatigue;Patient limited by pain Patient left: in bed;with call bell/phone within reach;with bed alarm set Nurse Communication: Mobility status PT Visit Diagnosis: Other abnormalities of gait and  mobility (R26.89);Other symptoms and signs involving the nervous system (R29.898);Difficulty in walking, not elsewhere classified (R26.2)    Time: 8696-8663 PT Time Calculation (min) (ACUTE ONLY): 33 min   Charges:   PT Evaluation $PT Eval Low Complexity: 1 Low   PT General Charges $$ ACUTE PT VISIT: 1 Visit         Sherlean Lesches DPT, PT    Macauley Mossberg A Annalia Metzger 05/28/2024, 2:11 PM

## 2024-05-28 NOTE — Progress Notes (Signed)
 [facility]- General Surgery  SURGICAL PROGRESS NOTE  Hospital Day(s): 0.   Post op day(s): 1 Day Post-Op.   Interval History:  Patient is status post day 1 robotic assisted laparoscopic appendectomy. This morning during encounter patient was asleep. Later in the day patient started complaining of abdominal discomfort.  She has tolerated regular diet.  No signs of nausea or vomiting.  Vital signs in last 24 hours: [min-max] current  Temp:  [96.9 F (36.1 C)-98.2 F (36.8 C)] 97.6 F (36.4 C) (01/20 0729) Pulse Rate:  [67-95] 84 (01/20 0729) Resp:  [11-25] 16 (01/20 0729) BP: (106-170)/(48-110) 121/96 (01/20 1011) SpO2:  [95 %-100 %] 98 % (01/20 0729)             Intake/Output last 2 shifts:  01/19 0701 - 01/20 0700 In: 300 [I.V.:200; IV Piggyback:100] Out: 700 [Urine:700]   Physical Exam:  Constitutional: alert, cooperative and no distress  Respiratory: breathing non-labored at rest  Cardiovascular: regular rate and sinus rhythm  Gastrointestinal: soft, tender more localized to right-side, and non-distended, surgical incisions are clean dry  Labs:     Latest Ref Rng & Units 05/27/2024    6:56 PM 09/21/2023    5:00 AM 09/20/2023    7:40 PM  CBC  WBC 4.0 - 10.5 K/uL 5.4  6.1  5.8   Hemoglobin 12.0 - 15.0 g/dL 85.2  87.2  85.5   Hematocrit 36.0 - 46.0 % 43.3  36.7  42.2   Platelets 150 - 400 K/uL 234  182  198       Latest Ref Rng & Units 05/27/2024    6:56 PM 09/21/2023    5:00 AM 09/20/2023    7:40 PM  CMP  Glucose 70 - 99 mg/dL 897  90  863   BUN 8 - 23 mg/dL 19  16  19    Creatinine 0.44 - 1.00 mg/dL 9.08  9.17  9.07   Sodium 135 - 145 mmol/L 144  140  139   Potassium 3.5 - 5.1 mmol/L 3.9  3.6  3.8   Chloride 98 - 111 mmol/L 105  108  106   CO2 22 - 32 mmol/L 27  25  26    Calcium 8.9 - 10.3 mg/dL 89.7  8.8  9.7   Total Protein 6.5 - 8.1 g/dL 7.5   6.9   Total Bilirubin 0.0 - 1.2 mg/dL 0.5   1.0   Alkaline Phos 38 - 126 U/L 95   55   AST 15 - 41 U/L 47   25    ALT 0 - 44 U/L 16   6     Imaging studies: No new pertinent imaging studies   Assessment/Plan:  84 y.o. female with acute appendicitis1 Day Post-Op s/p robotic assisted laparoscopic appendectomy, complicated by pertinent comorbidities including Parkinson disease.   - Stable vital signs, no fever and not tachycardic    - Tolerating regular diet. No signs of worsening pain. Difficult to assess patient given her parkinson's disease. PT/OT eval was added. Will continue IV Zosyn , pain management, and encouraged to ambulate.   -- Gilmer Cea PA-C

## 2024-05-30 LAB — SURGICAL PATHOLOGY

## 2024-06-01 LAB — CULTURE, BLOOD (SINGLE): Culture: NO GROWTH
